# Patient Record
Sex: Female | Born: 1940 | Race: White | Hispanic: No | Smoking: Former smoker
Health system: Southern US, Community
[De-identification: ages and names within clinical notes are randomized; demographics above are authoritative.]

## PROBLEM LIST (undated history)

## (undated) DIAGNOSIS — I2510 Atherosclerotic heart disease of native coronary artery without angina pectoris: Secondary | ICD-10-CM

## (undated) DIAGNOSIS — I10 Essential (primary) hypertension: Secondary | ICD-10-CM

## (undated) DIAGNOSIS — G709 Myoneural disorder, unspecified: Secondary | ICD-10-CM

## (undated) DIAGNOSIS — F32A Depression, unspecified: Secondary | ICD-10-CM

## (undated) DIAGNOSIS — Z972 Presence of dental prosthetic device (complete) (partial): Secondary | ICD-10-CM

## (undated) DIAGNOSIS — J189 Pneumonia, unspecified organism: Secondary | ICD-10-CM

## (undated) DIAGNOSIS — E119 Type 2 diabetes mellitus without complications: Secondary | ICD-10-CM

## (undated) DIAGNOSIS — R011 Cardiac murmur, unspecified: Secondary | ICD-10-CM

## (undated) DIAGNOSIS — C801 Malignant (primary) neoplasm, unspecified: Secondary | ICD-10-CM

## (undated) DIAGNOSIS — F329 Major depressive disorder, single episode, unspecified: Secondary | ICD-10-CM

## (undated) DIAGNOSIS — F419 Anxiety disorder, unspecified: Secondary | ICD-10-CM

## (undated) DIAGNOSIS — IMO0001 Reserved for inherently not codable concepts without codable children: Secondary | ICD-10-CM

## (undated) DIAGNOSIS — M1990 Unspecified osteoarthritis, unspecified site: Secondary | ICD-10-CM

## (undated) DIAGNOSIS — K219 Gastro-esophageal reflux disease without esophagitis: Secondary | ICD-10-CM

## (undated) DIAGNOSIS — G4730 Sleep apnea, unspecified: Secondary | ICD-10-CM

## (undated) DIAGNOSIS — I639 Cerebral infarction, unspecified: Secondary | ICD-10-CM

## (undated) DIAGNOSIS — F319 Bipolar disorder, unspecified: Secondary | ICD-10-CM

## (undated) DIAGNOSIS — R52 Pain, unspecified: Secondary | ICD-10-CM

## (undated) HISTORY — PX: EYE SURGERY: SHX253

## (undated) HISTORY — PX: COLONOSCOPY: SHX174

## (undated) HISTORY — PX: ABDOMINAL HYSTERECTOMY: SHX81

## (undated) HISTORY — PX: CATARACT EXTRACTION: SUR2

## (undated) HISTORY — PX: BLADDER SUSPENSION: SHX72

## (undated) HISTORY — PX: APPENDECTOMY: SHX54

## (undated) HISTORY — PX: TONSILLECTOMY: SUR1361

## (undated) HISTORY — PX: BACK SURGERY: SHX140

## (undated) HISTORY — PX: CORONARY ANGIOPLASTY WITH STENT PLACEMENT: SHX49

## (undated) HISTORY — PX: JOINT REPLACEMENT: SHX530

---

## 2006-06-25 ENCOUNTER — Ambulatory Visit: Payer: Self-pay

## 2006-06-27 ENCOUNTER — Ambulatory Visit: Payer: Self-pay

## 2006-09-19 ENCOUNTER — Ambulatory Visit: Payer: Self-pay

## 2009-01-31 ENCOUNTER — Ambulatory Visit: Payer: Self-pay | Admitting: Internal Medicine

## 2009-07-31 ENCOUNTER — Ambulatory Visit: Payer: Self-pay | Admitting: Internal Medicine

## 2009-12-10 ENCOUNTER — Ambulatory Visit: Payer: Self-pay | Admitting: Internal Medicine

## 2010-07-01 ENCOUNTER — Ambulatory Visit: Payer: Self-pay | Admitting: Internal Medicine

## 2010-07-12 ENCOUNTER — Ambulatory Visit: Payer: Self-pay | Admitting: Family Medicine

## 2012-01-12 ENCOUNTER — Ambulatory Visit: Payer: Self-pay

## 2012-09-29 ENCOUNTER — Ambulatory Visit: Payer: Self-pay | Admitting: Family Medicine

## 2012-09-29 LAB — URINALYSIS, COMPLETE
Bilirubin,UR: NEGATIVE
Ketone: NEGATIVE
Specific Gravity: 1.02 (ref 1.003–1.030)
Squamous Epithelial: NONE SEEN

## 2012-10-01 LAB — URINE CULTURE

## 2014-01-20 ENCOUNTER — Ambulatory Visit: Payer: Self-pay | Admitting: Physician Assistant

## 2014-01-20 LAB — URINALYSIS, COMPLETE
Bilirubin,UR: NEGATIVE
Glucose,UR: 500
KETONE: NEGATIVE
Nitrite: POSITIVE
Ph: 6 (ref 5.0–8.0)
Specific Gravity: 1.02 (ref 1.000–1.030)

## 2014-01-22 LAB — URINE CULTURE

## 2015-01-25 ENCOUNTER — Ambulatory Visit
Admission: EM | Admit: 2015-01-25 | Discharge: 2015-01-25 | Disposition: A | Discharge status: Home / Self Care | Payer: Medicare Other | Attending: Family Medicine | Admitting: Family Medicine

## 2015-01-25 DIAGNOSIS — L03011 Cellulitis of right finger: Secondary | ICD-10-CM | POA: Diagnosis not present

## 2015-01-25 HISTORY — DX: Type 2 diabetes mellitus without complications: E11.9

## 2015-01-25 HISTORY — DX: Atherosclerotic heart disease of native coronary artery without angina pectoris: I25.10

## 2015-01-25 HISTORY — DX: Essential (primary) hypertension: I10

## 2015-01-25 MED ORDER — SULFAMETHOXAZOLE-TRIMETHOPRIM 800-160 MG PO TABS: 1.0000 | ORAL_TABLET | Freq: Two times a day (BID) | ORAL | Status: AC

## 2015-01-25 NOTE — ED Notes (Addendum)
Had a hangnail right 4th fingernail and pulled and clipped off 4 days ago. Now reddness and with purulent color under the tip beside nail. Hx of DM and Glucose was 333 mg/dL at 0630 hrs. Today and 500 yesterday

## 2015-01-25 NOTE — ED Provider Notes (Signed)
Presents today with symptoms of right fourth digit pain and swelling. She states that about 4 days ago she pulled a hangnail from that digit. She has noticed a pus pocket and redness around the area of the nailbed. She has noticed some elevation of her blood sugars as she is diabetic. She denies any other infection anywhere else.  Review systems negative except mentioned above.  GENERAL: NAD RESP: CTA B CARD: RRR SKIN: mild and moderate erythema and swelling of right distal fourth digit, +pus pocket along the nailbed, increased tenderness of the area, decreased ROM, nv intact  NEURO: CN II-XII grossly intact   A/P: R Fourth Digit Paronychia- Procedure Note: Sterile technique was used. Lidocaine 1% without epi was used to digitally block the right fourth digit. An 11 blade was used to drain the region along the nailbed. There was white yellow pus that was expressed. Patient tolerated the procedure well. Instructed patient on keeping the area dry and clean, dressing was placed. Will put patient on Bactrim DS for a week. She is to monitor her blood sugars and the area for any worsening symptoms. If her symptoms do worsen she is to seek medical attention as discussed.     Paulina Fusi, MD 01/25/15 [phone]

## 2015-08-03 ENCOUNTER — Encounter: Payer: Self-pay | Admitting: *Deleted

## 2015-08-03 NOTE — Pre-Procedure Instructions (Signed)
CARDIOLOGIST DR Chong Sicilian [facility] OUT OF OFFICE UNTIL 08/09/15. PATIENT SAID DR MILLERTOLD HER SHE IS GOOD TO HAVE CATARACT SURGERY AND CATH DONE 6/17 AT [facility]. FAXED TO [facility] MEDICAL RECORDS TO OBTAIN COPY OF CATH SUMMARY

## 2015-08-07 NOTE — Pre-Procedure Instructions (Signed)
Cath report [facility] everywhere other results

## 2015-08-08 ENCOUNTER — Ambulatory Visit: Payer: Medicare Other | Admitting: Anesthesiology

## 2015-08-08 ENCOUNTER — Encounter: Payer: Self-pay | Admitting: *Deleted

## 2015-08-08 ENCOUNTER — Encounter
Admission: RE | Disposition: A | Discharge status: Home / Self Care | Payer: Self-pay | Source: Ambulatory Visit | Attending: Ophthalmology

## 2015-08-08 ENCOUNTER — Ambulatory Visit
Admission: RE | Admit: 2015-08-08 | Discharge: 2015-08-08 | Disposition: A | Discharge status: Home / Self Care | Payer: Medicare Other | Source: Ambulatory Visit | Attending: Ophthalmology | Admitting: Ophthalmology

## 2015-08-08 DIAGNOSIS — K219 Gastro-esophageal reflux disease without esophagitis: Secondary | ICD-10-CM | POA: Diagnosis not present

## 2015-08-08 DIAGNOSIS — R0602 Shortness of breath: Secondary | ICD-10-CM | POA: Diagnosis not present

## 2015-08-08 DIAGNOSIS — Z881 Allergy status to other antibiotic agents status: Secondary | ICD-10-CM | POA: Insufficient documentation

## 2015-08-08 DIAGNOSIS — F17290 Nicotine dependence, other tobacco product, uncomplicated: Secondary | ICD-10-CM | POA: Diagnosis not present

## 2015-08-08 DIAGNOSIS — Z96659 Presence of unspecified artificial knee joint: Secondary | ICD-10-CM | POA: Diagnosis not present

## 2015-08-08 DIAGNOSIS — Z955 Presence of coronary angioplasty implant and graft: Secondary | ICD-10-CM | POA: Diagnosis not present

## 2015-08-08 DIAGNOSIS — M1990 Unspecified osteoarthritis, unspecified site: Secondary | ICD-10-CM | POA: Diagnosis not present

## 2015-08-08 DIAGNOSIS — Z85828 Personal history of other malignant neoplasm of skin: Secondary | ICD-10-CM | POA: Insufficient documentation

## 2015-08-08 DIAGNOSIS — I2510 Atherosclerotic heart disease of native coronary artery without angina pectoris: Secondary | ICD-10-CM | POA: Diagnosis not present

## 2015-08-08 DIAGNOSIS — Z888 Allergy status to other drugs, medicaments and biological substances status: Secondary | ICD-10-CM | POA: Diagnosis not present

## 2015-08-08 DIAGNOSIS — Z90710 Acquired absence of both cervix and uterus: Secondary | ICD-10-CM | POA: Insufficient documentation

## 2015-08-08 DIAGNOSIS — H2512 Age-related nuclear cataract, left eye: Secondary | ICD-10-CM | POA: Insufficient documentation

## 2015-08-08 DIAGNOSIS — J40 Bronchitis, not specified as acute or chronic: Secondary | ICD-10-CM | POA: Diagnosis not present

## 2015-08-08 DIAGNOSIS — R011 Cardiac murmur, unspecified: Secondary | ICD-10-CM | POA: Diagnosis not present

## 2015-08-08 DIAGNOSIS — E7800 Pure hypercholesterolemia, unspecified: Secondary | ICD-10-CM | POA: Diagnosis not present

## 2015-08-08 DIAGNOSIS — E1140 Type 2 diabetes mellitus with diabetic neuropathy, unspecified: Secondary | ICD-10-CM | POA: Diagnosis not present

## 2015-08-08 HISTORY — DX: Depression, unspecified: F32.A

## 2015-08-08 HISTORY — DX: Anxiety disorder, unspecified: F41.9

## 2015-08-08 HISTORY — DX: Malignant (primary) neoplasm, unspecified: C80.1

## 2015-08-08 HISTORY — DX: Cardiac murmur, unspecified: R01.1

## 2015-08-08 HISTORY — DX: Reserved for inherently not codable concepts without codable children: IMO0001

## 2015-08-08 HISTORY — DX: Myoneural disorder, unspecified: G70.9

## 2015-08-08 HISTORY — DX: Bipolar disorder, unspecified: F31.9

## 2015-08-08 HISTORY — DX: Pain, unspecified: R52

## 2015-08-08 HISTORY — DX: Unspecified osteoarthritis, unspecified site: M19.90

## 2015-08-08 HISTORY — DX: Gastro-esophageal reflux disease without esophagitis: K21.9

## 2015-08-08 HISTORY — PX: CATARACT EXTRACTION W/PHACO: SHX586

## 2015-08-08 HISTORY — DX: Major depressive disorder, single episode, unspecified: F32.9

## 2015-08-08 LAB — GLUCOSE, CAPILLARY: Glucose-Capillary: 197 mg/dL — ABNORMAL HIGH (ref 65–99)

## 2015-08-08 SURGERY — PHACOEMULSIFICATION, CATARACT, WITH IOL INSERTION
Anesthesia: Monitor Anesthesia Care | Site: Eye | Laterality: Left | Wound class: Clean

## 2015-08-08 MED ORDER — ARMC OPHTHALMIC DILATING GEL
1.0000 "application " | OPHTHALMIC | Status: DC | PRN
  Administered 2015-08-08: 1 via OPHTHALMIC

## 2015-08-08 MED ORDER — EPINEPHRINE HCL 1 MG/ML IJ SOLN
INTRAMUSCULAR | Status: AC
  Filled 2015-08-08: qty 1

## 2015-08-08 MED ORDER — EPINEPHRINE HCL 1 MG/ML IJ SOLN
INTRAMUSCULAR | Status: DC | PRN
  Administered 2015-08-08: 1 mL via OPHTHALMIC

## 2015-08-08 MED ORDER — POVIDONE-IODINE 5 % OP SOLN
1.0000 "application " | Freq: Once | OPHTHALMIC | Status: AC
  Administered 2015-08-08: 1 via OPHTHALMIC

## 2015-08-08 MED ORDER — CEFUROXIME OPHTHALMIC INJECTION 1 MG/0.1 ML
INJECTION | OPHTHALMIC | Status: DC | PRN
  Administered 2015-08-08: .1 mL via INTRACAMERAL

## 2015-08-08 MED ORDER — CEFUROXIME OPHTHALMIC INJECTION 1 MG/0.1 ML
INJECTION | OPHTHALMIC | Status: AC
  Filled 2015-08-08: qty 0.1

## 2015-08-08 MED ORDER — ALFENTANIL 500 MCG/ML IJ INJ
INJECTION | INTRAMUSCULAR | Status: DC | PRN
  Administered 2015-08-08 (×2): 250 ug via INTRAVENOUS

## 2015-08-08 MED ORDER — POVIDONE-IODINE 5 % OP SOLN
OPHTHALMIC | Status: AC
  Administered 2015-08-08: 1 via OPHTHALMIC
  Filled 2015-08-08: qty 30

## 2015-08-08 MED ORDER — CARBACHOL 0.01 % IO SOLN
INTRAOCULAR | Status: DC | PRN
Start: 2015-08-08 — End: 2015-08-08
  Administered 2015-08-08: .5 mL via INTRAOCULAR

## 2015-08-08 MED ORDER — ARMC OPHTHALMIC DILATING GEL
OPHTHALMIC | Status: AC
  Administered 2015-08-08: 1 via OPHTHALMIC
  Filled 2015-08-08: qty 0.25

## 2015-08-08 MED ORDER — NA CHONDROIT SULF-NA HYALURON 40-17 MG/ML IO SOLN
INTRAOCULAR | Status: AC
  Filled 2015-08-08: qty 1

## 2015-08-08 MED ORDER — TETRACAINE HCL 0.5 % OP SOLN
1.0000 [drp] | Freq: Once | OPHTHALMIC | Status: AC
  Administered 2015-08-08: 1 [drp] via OPHTHALMIC

## 2015-08-08 MED ORDER — SODIUM CHLORIDE 0.9 % IV SOLN
INTRAVENOUS | Status: DC
  Administered 2015-08-08: 09:00:00 via INTRAVENOUS

## 2015-08-08 MED ORDER — TETRACAINE HCL 0.5 % OP SOLN
OPHTHALMIC | Status: AC
  Administered 2015-08-08: 1 [drp] via OPHTHALMIC
  Filled 2015-08-08: qty 2

## 2015-08-08 MED ORDER — MOXIFLOXACIN HCL 0.5 % OP SOLN: 1.0000 [drp] | OPHTHALMIC | Status: DC | PRN

## 2015-08-08 MED ORDER — MIDAZOLAM HCL 2 MG/2ML IJ SOLN
INTRAMUSCULAR | Status: DC | PRN
  Administered 2015-08-08: 1 mg via INTRAVENOUS

## 2015-08-08 MED ORDER — MOXIFLOXACIN HCL 0.5 % OP SOLN
OPHTHALMIC | Status: AC
  Filled 2015-08-08: qty 3

## 2015-08-08 MED ORDER — MOXIFLOXACIN HCL 0.5 % OP SOLN
OPHTHALMIC | Status: DC | PRN
  Administered 2015-08-08: 1 [drp] via OPHTHALMIC

## 2015-08-08 MED ORDER — NA CHONDROIT SULF-NA HYALURON 40-17 MG/ML IO SOLN
INTRAOCULAR | Status: DC | PRN
  Administered 2015-08-08: 1 mL via INTRAOCULAR

## 2015-08-08 SURGICAL SUPPLY — 21 items
CANNULA ANT/CHMB 27GA (MISCELLANEOUS) ×3 IMPLANT
CUP MEDICINE 2OZ PLAST GRAD ST (MISCELLANEOUS) ×3 IMPLANT
GLOVE BIO SURGEON STRL SZ8 (GLOVE) ×3 IMPLANT
GLOVE BIOGEL M 6.5 STRL (GLOVE) ×3 IMPLANT
GLOVE SURG LX 8.0 MICRO (GLOVE) ×2
GLOVE SURG LX STRL 8.0 MICRO (GLOVE) ×1 IMPLANT
GOWN STRL REUS W/ TWL LRG LVL3 (GOWN DISPOSABLE) ×2 IMPLANT
GOWN STRL REUS W/TWL LRG LVL3 (GOWN DISPOSABLE) ×4
LENS IOL TECNIS ITEC 20.5 (Intraocular Lens) ×3 IMPLANT
PACK CATARACT (MISCELLANEOUS) ×3 IMPLANT
PACK CATARACT BRASINGTON LX (MISCELLANEOUS) ×3 IMPLANT
PACK EYE AFTER SURG (MISCELLANEOUS) ×3 IMPLANT
SOL BSS BAG (MISCELLANEOUS) ×3
SOL PREP PVP 2OZ (MISCELLANEOUS) ×3
SOLUTION BSS BAG (MISCELLANEOUS) ×1 IMPLANT
SOLUTION PREP PVP 2OZ (MISCELLANEOUS) ×1 IMPLANT
SYR 3ML LL SCALE MARK (SYRINGE) ×3 IMPLANT
SYR 5ML LL (SYRINGE) ×3 IMPLANT
SYR TB 1ML 27GX1/2 LL (SYRINGE) ×3 IMPLANT
WATER STERILE IRR 1000ML POUR (IV SOLUTION) ×3 IMPLANT
WIPE NON LINTING 3.25X3.25 (MISCELLANEOUS) ×3 IMPLANT

## 2015-08-08 NOTE — Anesthesia Postprocedure Evaluation (Signed)
Anesthesia Post Note  Patient: Haley Mooney  Procedure(s) Performed: Procedure(s) (LRB): CATARACT EXTRACTION PHACO AND INTRAOCULAR LENS PLACEMENT (IOC) (Left)  Patient location during evaluation: PACU Anesthesia Type: General Level of consciousness: awake and alert Pain management: pain level controlled Vital Signs Assessment: post-procedure vital signs reviewed and stable Respiratory status: spontaneous breathing, nonlabored ventilation, respiratory function stable and patient connected to nasal cannula oxygen Cardiovascular status: blood pressure returned to baseline and stable Postop Assessment: no signs of nausea or vomiting Anesthetic complications: no    Last Vitals:  Vitals:   08/08/15 1002 08/08/15 1019  BP: 124/72 130/77  Pulse: 61 67  Resp:  16  Temp: 36.6 C     Last Pain:  Vitals:   08/08/15 0844  TempSrc: Oral                 Molli Barrows

## 2015-08-08 NOTE — H&P (Signed)
  All labs reviewed. Abnormal studies sent to patients PCP when indicated.  Previous H&P reviewed, patient examined, there are NO CHANGES.  Kuper Rennels [name]8/1/20179:32 AM

## 2015-08-08 NOTE — Anesthesia Preprocedure Evaluation (Signed)
Anesthesia Evaluation  Patient identified by MRN, date of birth, ID band Patient awake    Reviewed: Allergy & Precautions, H&P , NPO status , Patient's Chart, lab work & pertinent test results, reviewed documented beta blocker date and time   Airway Mallampati: II  TM Distance: >3 FB Neck ROM: full    Dental no notable dental hx. (+) Poor Dentition   Pulmonary neg pulmonary ROS, former smoker,    Pulmonary exam normal breath sounds clear to auscultation       Cardiovascular Exercise Tolerance: Good hypertension, + CAD  negative cardio ROS  + Valvular Problems/Murmurs  Rhythm:regular Rate:Normal     Neuro/Psych PSYCHIATRIC DISORDERS  Neuromuscular disease negative neurological ROS  negative psych ROS   GI/Hepatic negative GI ROS, Neg liver ROS, GERD  Medicated,  Endo/Other  negative endocrine ROSdiabetes  Renal/GU      Musculoskeletal   Abdominal   Peds  Hematology negative hematology ROS (+)   Anesthesia Other Findings   Reproductive/Obstetrics negative OB ROS                             Anesthesia Physical Anesthesia Plan  ASA: III  Anesthesia Plan: MAC   Post-op Pain Management:    Induction:   Airway Management Planned:   Additional Equipment:   Intra-op Plan:   Post-operative Plan:   Informed Consent: I have reviewed the patients History and Physical, chart, labs and discussed the procedure including the risks, benefits and alternatives for the proposed anesthesia with the patient or authorized representative who has indicated his/her understanding and acceptance.     Plan Discussed with: CRNA  Anesthesia Plan Comments:         Anesthesia Quick Evaluation

## 2015-08-08 NOTE — Op Note (Signed)
PREOPERATIVE DIAGNOSIS:  Nuclear sclerotic cataract of the left eye.   POSTOPERATIVE DIAGNOSIS:  nuclear sclerotic cataract left eye   OPERATIVE PROCEDURE:  Procedure(s): CATARACT EXTRACTION PHACO AND INTRAOCULAR LENS PLACEMENT (IOC)   SURGEON:  Birder Robson, MD.   ANESTHESIA:   Anesthesiologist: Molli Barrows, MD CRNA: Courtney Paris, CRNA  1.      Managed anesthesia care. 2.      Topical tetracaine drops followed by 2% Xylocaine jelly applied in the preoperative holding area.   COMPLICATIONS:  None.   TECHNIQUE:   Stop and chop   DESCRIPTION OF PROCEDURE:  The patient was examined and consented in the preoperative holding area where the aforementioned topical anesthesia was applied to the left eye and then brought back to the Operating Room where the left eye was prepped and draped in the usual sterile ophthalmic fashion and a lid speculum was placed. A paracentesis was created with the side port blade and the anterior chamber was filled with viscoelastic. A near clear corneal incision was performed with the steel keratome. A continuous curvilinear capsulorrhexis was performed with a cystotome followed by the capsulorrhexis forceps. Hydrodissection and hydrodelineation were carried out with BSS on a blunt cannula. The lens was removed in a stop and chop  technique and the remaining cortical material was removed with the irrigation-aspiration handpiece. The capsular bag was inflated with viscoelastic and the Technis ZCB00 lens was placed in the capsular bag without complication. The remaining viscoelastic was removed from the eye with the irrigation-aspiration handpiece. The wounds were hydrated. The anterior chamber was flushed with Miostat and the eye was inflated to physiologic pressure. 0.1 mL of cefuroxime concentration 10 mg/mL was placed in the anterior chamber. The wounds were found to be water tight. The eye was dressed with Vigamox. The patient was given protective glasses to wear  throughout the day and a shield with which to sleep tonight. The patient was also given drops with which to begin a drop regimen today and will follow-up with me in one day.  Implant Name Type Inv. Item Serial No. Manufacturer Lot No. LRB No. Used  LENS IOL DIOP 20.5 - [id number] Intraocular Lens LENS IOL DIOP 20.5 [id number]   Left 1   Procedure(s) with comments: CATARACT EXTRACTION PHACO AND INTRAOCULAR LENS PLACEMENT (IOC) (Left) - [place] 00:55 AP% 17.1 CDE 9.48 fluid pack lot # [id number]  Electronically signed: [place] 08/08/2015 10:01 AM

## 2015-08-08 NOTE — Transfer of Care (Signed)
Immediate Anesthesia Transfer of Care Note  Patient: Emine Fazenbaker  Procedure(s) Performed: Procedure(s) with comments: CATARACT EXTRACTION PHACO AND INTRAOCULAR LENS PLACEMENT (IOC) (Left) - [place] 00:55 AP% 17.1 CDE 9.48 fluid pack lot # [id number]  Patient Location: PACU and Short Stay  Anesthesia Type:MAC  Level of Consciousness: awake, oriented and patient cooperative  Airway & Oxygen Therapy: Patient Spontanous Breathing and Patient connected to nasal cannula oxygen  Post-op Assessment: Report given to RN and Post -op Vital signs reviewed and stable  Post vital signs: Reviewed and stable  Last Vitals:  Vitals:   08/08/15 0844  BP: 110/73  Pulse: 70  Resp: 16  Temp: 37.2 C    Last Pain:  Vitals:   08/08/15 0844  TempSrc: Oral         Complications: No apparent anesthesia complications

## 2015-08-08 NOTE — Discharge Instructions (Signed)
Eye Surgery Discharge Instructions  Expect mild scratchy sensation or mild soreness. DO NOT RUB YOUR EYE!  The day of surgery:  Minimal physical activity, but bed rest is not required  No reading, computer work, or close hand work  No bending, lifting, or straining.  May watch TV  For 24 hours:  No driving, legal decisions, or alcoholic beverages  Safety precautions  Eat anything you prefer: It is better to start with liquids, then soup then solid foods.  _____ Eye patch should be worn until postoperative exam tomorrow.  ____ Solar shield eyeglasses should be worn for comfort in the sunlight/patch while sleeping  Resume all regular medications including aspirin or Coumadin if these were discontinued prior to surgery. You may shower, bathe, shave, or wash your hair. Tylenol may be taken for mild discomfort.  Call your doctor if you experience significant pain, nausea, or vomiting, fever > 101 or other signs of infection. [phone] or [phone] Specific instructions:  Follow-up Information    PORFILIO,WILLIAM [name], MD Follow up on 08/09/2015.   Specialty:  Ophthalmology Why:  10:25 Contact information: [address] [place] [address] [phone]

## 2015-08-15 ENCOUNTER — Encounter: Payer: Self-pay | Admitting: Emergency Medicine

## 2015-08-15 ENCOUNTER — Ambulatory Visit
Admission: EM | Admit: 2015-08-15 | Discharge: 2015-08-15 | Disposition: A | Discharge status: Home / Self Care | Payer: Medicare Other | Attending: Family Medicine | Admitting: Family Medicine

## 2015-08-15 ENCOUNTER — Ambulatory Visit: Payer: Medicare Other

## 2015-08-15 DIAGNOSIS — Z794 Long term (current) use of insulin: Secondary | ICD-10-CM | POA: Insufficient documentation

## 2015-08-15 DIAGNOSIS — M7130 Other bursal cyst, unspecified site: Secondary | ICD-10-CM

## 2015-08-15 DIAGNOSIS — F319 Bipolar disorder, unspecified: Secondary | ICD-10-CM | POA: Insufficient documentation

## 2015-08-15 DIAGNOSIS — Z7982 Long term (current) use of aspirin: Secondary | ICD-10-CM | POA: Diagnosis not present

## 2015-08-15 DIAGNOSIS — Z79899 Other long term (current) drug therapy: Secondary | ICD-10-CM | POA: Insufficient documentation

## 2015-08-15 DIAGNOSIS — K219 Gastro-esophageal reflux disease without esophagitis: Secondary | ICD-10-CM | POA: Diagnosis not present

## 2015-08-15 DIAGNOSIS — F419 Anxiety disorder, unspecified: Secondary | ICD-10-CM | POA: Diagnosis not present

## 2015-08-15 DIAGNOSIS — I2510 Atherosclerotic heart disease of native coronary artery without angina pectoris: Secondary | ICD-10-CM | POA: Diagnosis not present

## 2015-08-15 DIAGNOSIS — F17200 Nicotine dependence, unspecified, uncomplicated: Secondary | ICD-10-CM | POA: Insufficient documentation

## 2015-08-15 DIAGNOSIS — E119 Type 2 diabetes mellitus without complications: Secondary | ICD-10-CM | POA: Insufficient documentation

## 2015-08-15 DIAGNOSIS — I10 Essential (primary) hypertension: Secondary | ICD-10-CM | POA: Diagnosis not present

## 2015-08-15 DIAGNOSIS — M79675 Pain in left toe(s): Secondary | ICD-10-CM | POA: Diagnosis present

## 2015-08-15 DIAGNOSIS — M25872 Other specified joint disorders, left ankle and foot: Secondary | ICD-10-CM | POA: Insufficient documentation

## 2015-08-15 MED ORDER — MUPIROCIN 2 % EX OINT: 1.0000 "application " | TOPICAL_OINTMENT | Freq: Three times a day (TID) | CUTANEOUS | 0 refills | Status: DC

## 2015-08-15 NOTE — ED Triage Notes (Signed)
Patient c/o tender bump on her left 1st toe that started back in May 2017.

## 2015-08-15 NOTE — ED Provider Notes (Signed)
CSN: [id number]     Arrival date & time 08/15/15  [id number] History   First MD Initiated Contact with Patient 08/15/15 [phone]     Chief Complaint  Patient presents with  . Toe Pain   (Consider location/radiation/quality/duration/timing/severity/associated sxs/prior Treatment)  Toe Pain      Assessment 75 year old female diabetic who presents with a cyst that has appeared on her medial left great toe dorsum located just distal to her DIP joint but not involving the nail. He states that it has doubled in size over the last day. She is mostly concerned because of her underlying diabetes. It has not been leaking at the present time and does not appear to be infected. Measures approximately 4 mm wide. It has a central core appearing plugged up. Underlying this cyst is a prominence of the DIP joint.  Past Medical History:  Diagnosis Date  . Anxiety   . Arthritis   . Bipolar disorder ([place])   . CAD (coronary artery disease)   . Cancer ([place])    SKIN  . Depression   . Diabetes mellitus without complication ([place])   . GERD (gastroesophageal reflux disease)    H/O  . Heart murmur   . Hypertension   . Neuromuscular disorder ([place])    NEUROPATHY  . Pain    CHEST OCC WITH EXERTION  . Shortness of breath dyspnea    Past Surgical History:  Procedure Laterality Date  . ABDOMINAL HYSTERECTOMY    . APPENDECTOMY    . BLADDER SUSPENSION    . CATARACT EXTRACTION Left   . CATARACT EXTRACTION W/PHACO Left 08/08/2015   Procedure: CATARACT EXTRACTION PHACO AND INTRAOCULAR LENS PLACEMENT (IOC);  Surgeon: Birder Robson, MD;  Location: [facility] ORS;  Service: Ophthalmology;  Laterality: Left;  [place] 00:55 AP% 17.1 CDE 9.48 fluid pack lot # [id number]  . COLONOSCOPY    . CORONARY ANGIOPLASTY WITH STENT PLACEMENT     CATH DONE [facility] 6/17 DR PAULA MILLER  . EYE SURGERY    . JOINT REPLACEMENT    . TONSILLECTOMY     Family History  Problem Relation Age of Onset  . Heart failure Father    Social History  Substance  Use Topics  . Smoking status: Former Smoker    Types: E-cigarettes  . Smokeless tobacco: Current User  . Alcohol use No   OB History    No data available     Review of Systems  Constitutional: Negative for activity change, chills, fatigue and fever.  Musculoskeletal: Positive for arthralgias.  Skin: Positive for color change.  All other systems reviewed and are negative.   Allergies  Ace inhibitors; Beta adrenergic blockers; and Tetracyclines & related  Home Medications   Prior to Admission medications   Medication Sig Start Date End Date Taking? Authorizing Provider  albuterol (PROVENTIL HFA;VENTOLIN HFA) 108 (90 Base) MCG/ACT inhaler Inhale 2 puffs into the lungs every 6 (six) hours as needed for wheezing or shortness of breath.    Historical Provider, MD  ARIPiprazole (ABILIFY) 5 MG tablet Take 5 mg by mouth daily.    Historical Provider, MD  aspirin 81 MG tablet Take 81 mg by mouth daily.    Historical Provider, MD  atorvastatin (LIPITOR) 80 MG tablet Take 80 mg by mouth daily at 6 PM.    Historical Provider, MD  clonazePAM (KLONOPIN) 0.5 MG tablet Take 0.5 mg by mouth 3 (three) times daily as needed for anxiety.    Historical Provider, MD  gabapentin (NEURONTIN) 300 MG capsule  Take 300 mg by mouth at bedtime.    Historical Provider, MD  Garcinia Cambogia-Chromium 500-200 MG-MCG TABS Take 1 tablet by mouth 2 (two) times daily.    Historical Provider, MD  glipiZIDE (GLUCOTROL) 10 MG tablet Take 10 mg by mouth 2 (two) times daily before a meal.    Historical Provider, MD  insulin detemir (LEVEMIR) 100 UNIT/ML injection Inject 20 Units into the skin at bedtime.    Historical Provider, MD  Melatonin 10 MG CAPS Take 10 mg by mouth at bedtime.    Historical Provider, MD  metFORMIN (GLUMETZA) 1000 MG (MOD) 24 hr tablet Take 1,000 mg by mouth 2 (two) times daily with a meal.    Historical Provider, MD  metoprolol succinate (TOPROL-XL) 50 MG 24 hr tablet Take 50 mg by mouth daily. Take  with or immediately following a meal.    Historical Provider, MD  mupirocin ointment (BACTROBAN) 2 % Apply 1 application topically 3 (three) times daily. 08/15/15   Lorin Picket, PA-C  nitroGLYCERIN (NITROSTAT) 0.4 MG SL tablet Place 0.4 mg under the tongue every 5 (five) minutes as needed for chest pain.    Historical Provider, MD  QUEtiapine (SEROQUEL) 50 MG tablet Take 50 mg by mouth at bedtime.    Historical Provider, MD  venlafaxine XR (EFFEXOR-XR) 150 MG 24 hr capsule Take 150 mg by mouth daily with breakfast.    Historical Provider, MD   Meds Ordered and Administered this Visit  Medications - No data to display  BP 137/70 (BP Location: Right Arm)   Pulse 66   Temp 97.6 F (36.4 C) (Tympanic)   Resp 16   Ht 5\' 6"  (1.676 m)   Wt 220 lb (99.8 kg)   SpO2 98%   BMI 35.51 kg/m  No data found.   Physical Exam  Constitutional: She appears well-developed and well-nourished. No distress.  HENT:  Head: Normocephalic and atraumatic.  Eyes: EOM are normal. Pupils are equal, round, and reactive to light.  Neck: Normal range of motion. Neck supple.  Musculoskeletal: She exhibits edema and tenderness.  Examination of the left great toe shows over the dorsum medial aspect overlying the DIP joint not affecting the nail is a 4 mm round fluid-filled cyst with a central appearing core plug. Early it is not draining. Underlying the cyst is a prominence of the  DIP joint. There is no evidence of infection at the present time. Cyst is not presently leaking.  Skin: She is not diaphoretic.  Nursing note and vitals reviewed.   Urgent Care Course   Clinical Course    Procedures (including critical care time)  Labs Review Labs Reviewed - No data to display  Imaging Review Dg Toe Great Left  Result Date: 08/15/2015 CLINICAL DATA:  Pain in the left great toe with blister, no acute trauma EXAM: LEFT GREAT TOE COMPARISON:  None. FINDINGS: There is focal soft tissue prominence over the dorsal  aspect of the left great toe at the level of the left DIP joint of uncertain significance. No underlying bony abnormality is seen. There is degenerative change involving the left first MTP joint with loss of joint space and sclerosis with spurring. IMPRESSION: 1. Focal soft tissue prominence over the dorsal aspect of the left great toe. No underlying bony abnormality. 2. 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   On: 08/15/2015 09:56     Visual Acuity Review  Right Eye Distance:  Left Eye Distance:   Bilateral Distance:    Right Eye Near:   Left Eye Near:    Bilateral Near:         MDM   1. Myxoid cyst    Discharge Medication List as of 08/15/2015 10:14 AM    START taking these medications   Details  mupirocin ointment (BACTROBAN) 2 % Apply 1 application topically 3 (three) times daily., Starting Tue 08/15/2015, Normal      Plan: 1. Test/x-ray results and diagnosis reviewed with patient 2. rx as per orders; risks, benefits, potential side effects reviewed with patient 3. Recommend supportive treatment with avoidance of pressure over the cyst is much as feasible. I will  provide her with Bactroban ointment to put on the cyst  to minimize the risk of an infection should this rupture. 4. F/u Dr. Vickki Muff for definitive treatment.     Lorin Picket, PA-C 08/15/15 1039

## 2015-08-23 ENCOUNTER — Encounter: Payer: Self-pay | Admitting: *Deleted

## 2015-09-05 ENCOUNTER — Ambulatory Visit: Payer: Medicare Other | Admitting: Certified Registered"

## 2015-09-05 ENCOUNTER — Ambulatory Visit
Admission: RE | Admit: 2015-09-05 | Discharge: 2015-09-05 | Disposition: A | Discharge status: Home / Self Care | Payer: Medicare Other | Source: Ambulatory Visit | Attending: Ophthalmology | Admitting: Ophthalmology

## 2015-09-05 ENCOUNTER — Encounter
Admission: RE | Disposition: A | Discharge status: Home / Self Care | Payer: Self-pay | Source: Ambulatory Visit | Attending: Ophthalmology

## 2015-09-05 ENCOUNTER — Encounter: Payer: Self-pay | Admitting: *Deleted

## 2015-09-05 DIAGNOSIS — Z794 Long term (current) use of insulin: Secondary | ICD-10-CM | POA: Insufficient documentation

## 2015-09-05 DIAGNOSIS — F17290 Nicotine dependence, other tobacco product, uncomplicated: Secondary | ICD-10-CM | POA: Diagnosis not present

## 2015-09-05 DIAGNOSIS — Z85828 Personal history of other malignant neoplasm of skin: Secondary | ICD-10-CM | POA: Diagnosis not present

## 2015-09-05 DIAGNOSIS — Z955 Presence of coronary angioplasty implant and graft: Secondary | ICD-10-CM | POA: Insufficient documentation

## 2015-09-05 DIAGNOSIS — K219 Gastro-esophageal reflux disease without esophagitis: Secondary | ICD-10-CM | POA: Diagnosis not present

## 2015-09-05 DIAGNOSIS — J40 Bronchitis, not specified as acute or chronic: Secondary | ICD-10-CM | POA: Insufficient documentation

## 2015-09-05 DIAGNOSIS — Z96659 Presence of unspecified artificial knee joint: Secondary | ICD-10-CM | POA: Diagnosis not present

## 2015-09-05 DIAGNOSIS — E7800 Pure hypercholesterolemia, unspecified: Secondary | ICD-10-CM | POA: Insufficient documentation

## 2015-09-05 DIAGNOSIS — F418 Other specified anxiety disorders: Secondary | ICD-10-CM | POA: Diagnosis not present

## 2015-09-05 DIAGNOSIS — R011 Cardiac murmur, unspecified: Secondary | ICD-10-CM | POA: Diagnosis not present

## 2015-09-05 DIAGNOSIS — E1136 Type 2 diabetes mellitus with diabetic cataract: Secondary | ICD-10-CM | POA: Diagnosis not present

## 2015-09-05 DIAGNOSIS — F319 Bipolar disorder, unspecified: Secondary | ICD-10-CM | POA: Diagnosis not present

## 2015-09-05 DIAGNOSIS — I10 Essential (primary) hypertension: Secondary | ICD-10-CM | POA: Diagnosis not present

## 2015-09-05 DIAGNOSIS — Z888 Allergy status to other drugs, medicaments and biological substances status: Secondary | ICD-10-CM | POA: Insufficient documentation

## 2015-09-05 DIAGNOSIS — Z90710 Acquired absence of both cervix and uterus: Secondary | ICD-10-CM | POA: Diagnosis not present

## 2015-09-05 DIAGNOSIS — E1140 Type 2 diabetes mellitus with diabetic neuropathy, unspecified: Secondary | ICD-10-CM | POA: Insufficient documentation

## 2015-09-05 DIAGNOSIS — I2510 Atherosclerotic heart disease of native coronary artery without angina pectoris: Secondary | ICD-10-CM | POA: Diagnosis not present

## 2015-09-05 DIAGNOSIS — H2511 Age-related nuclear cataract, right eye: Secondary | ICD-10-CM | POA: Diagnosis present

## 2015-09-05 HISTORY — PX: CATARACT EXTRACTION W/PHACO: SHX586

## 2015-09-05 HISTORY — DX: Pneumonia, unspecified organism: J18.9

## 2015-09-05 LAB — GLUCOSE, CAPILLARY: GLUCOSE-CAPILLARY: 276 mg/dL — AB (ref 65–99)

## 2015-09-05 SURGERY — PHACOEMULSIFICATION, CATARACT, WITH IOL INSERTION
Anesthesia: Monitor Anesthesia Care | Site: Eye | Laterality: Right | Wound class: Clean

## 2015-09-05 MED ORDER — EPINEPHRINE HCL 1 MG/ML IJ SOLN
INTRAOCULAR | Status: DC | PRN
  Administered 2015-09-05: 200 mL via OPHTHALMIC

## 2015-09-05 MED ORDER — MIDAZOLAM HCL 2 MG/2ML IJ SOLN
INTRAMUSCULAR | Status: DC | PRN
  Administered 2015-09-05 (×2): 1 mg via INTRAVENOUS

## 2015-09-05 MED ORDER — CARBACHOL 0.01 % IO SOLN
INTRAOCULAR | Status: DC | PRN
  Administered 2015-09-05: 0.5 mL via INTRAOCULAR

## 2015-09-05 MED ORDER — SODIUM CHLORIDE 0.9 % IV SOLN
INTRAVENOUS | Status: DC
  Administered 2015-09-05: 10:00:00 via INTRAVENOUS

## 2015-09-05 MED ORDER — METOPROLOL TARTRATE 50 MG PO TABS
ORAL_TABLET | ORAL | Status: AC
  Administered 2015-09-05: 50 mg via ORAL
  Filled 2015-09-05: qty 1

## 2015-09-05 MED ORDER — POVIDONE-IODINE 5 % OP SOLN
1.0000 "application " | Freq: Once | OPHTHALMIC | Status: AC
  Administered 2015-09-05: 1 via OPHTHALMIC

## 2015-09-05 MED ORDER — POVIDONE-IODINE 5 % OP SOLN
OPHTHALMIC | Status: AC
  Administered 2015-09-05: 1 via OPHTHALMIC
  Filled 2015-09-05: qty 30

## 2015-09-05 MED ORDER — MOXIFLOXACIN HCL 0.5 % OP SOLN
OPHTHALMIC | Status: AC
  Filled 2015-09-05: qty 3

## 2015-09-05 MED ORDER — NA CHONDROIT SULF-NA HYALURON 40-17 MG/ML IO SOLN
INTRAOCULAR | Status: AC
  Filled 2015-09-05: qty 1

## 2015-09-05 MED ORDER — TETRACAINE HCL 0.5 % OP SOLN
1.0000 [drp] | Freq: Once | OPHTHALMIC | Status: AC
  Administered 2015-09-05: 1 [drp] via OPHTHALMIC

## 2015-09-05 MED ORDER — CEFUROXIME OPHTHALMIC INJECTION 1 MG/0.1 ML
INJECTION | OPHTHALMIC | Status: AC
  Filled 2015-09-05: qty 0.1

## 2015-09-05 MED ORDER — ARMC OPHTHALMIC DILATING GEL
OPHTHALMIC | Status: AC
  Administered 2015-09-05: 1 via OPHTHALMIC
  Filled 2015-09-05: qty 0.25

## 2015-09-05 MED ORDER — NA CHONDROIT SULF-NA HYALURON 40-17 MG/ML IO SOLN
INTRAOCULAR | Status: DC | PRN
  Administered 2015-09-05: 1 mL via INTRAOCULAR

## 2015-09-05 MED ORDER — MOXIFLOXACIN HCL 0.5 % OP SOLN: 1.0000 [drp] | OPHTHALMIC | Status: DC | PRN

## 2015-09-05 MED ORDER — FENTANYL CITRATE (PF) 100 MCG/2ML IJ SOLN
INTRAMUSCULAR | Status: DC | PRN
  Administered 2015-09-05: 50 ug via INTRAVENOUS

## 2015-09-05 MED ORDER — EPINEPHRINE HCL 1 MG/ML IJ SOLN
INTRAMUSCULAR | Status: AC
  Filled 2015-09-05: qty 1

## 2015-09-05 MED ORDER — CEFUROXIME OPHTHALMIC INJECTION 1 MG/0.1 ML
INJECTION | OPHTHALMIC | Status: DC | PRN
  Administered 2015-09-05: 0.1 mL via INTRACAMERAL

## 2015-09-05 MED ORDER — METOPROLOL TARTRATE 50 MG PO TABS
50.0000 mg | ORAL_TABLET | Freq: Once | ORAL | Status: AC
  Administered 2015-09-05: 50 mg via ORAL

## 2015-09-05 MED ORDER — MOXIFLOXACIN HCL 0.5 % OP SOLN
OPHTHALMIC | Status: DC | PRN
  Administered 2015-09-05: 1 [drp] via OPHTHALMIC

## 2015-09-05 MED ORDER — ARMC OPHTHALMIC DILATING GEL
1.0000 | OPHTHALMIC | Status: AC | PRN
Start: 2015-09-05 — End: 2015-09-05
  Administered 2015-09-05 (×2): 1 via OPHTHALMIC

## 2015-09-05 MED ORDER — TETRACAINE HCL 0.5 % OP SOLN
OPHTHALMIC | Status: AC
  Administered 2015-09-05: 1 [drp] via OPHTHALMIC
  Filled 2015-09-05: qty 2

## 2015-09-05 SURGICAL SUPPLY — 21 items
CANNULA ANT/CHMB 27GA (MISCELLANEOUS) ×3 IMPLANT
CUP MEDICINE 2OZ PLAST GRAD ST (MISCELLANEOUS) ×3 IMPLANT
GLOVE BIO SURGEON STRL SZ8 (GLOVE) ×3 IMPLANT
GLOVE BIOGEL M 6.5 STRL (GLOVE) ×3 IMPLANT
GLOVE SURG LX 8.0 MICRO (GLOVE) ×2
GLOVE SURG LX STRL 8.0 MICRO (GLOVE) ×1 IMPLANT
GOWN STRL REUS W/ TWL LRG LVL3 (GOWN DISPOSABLE) ×2 IMPLANT
GOWN STRL REUS W/TWL LRG LVL3 (GOWN DISPOSABLE) ×4
LENS IOL TECNIS ITEC 20.5 (Intraocular Lens) ×3 IMPLANT
PACK CATARACT (MISCELLANEOUS) ×3 IMPLANT
PACK CATARACT BRASINGTON LX (MISCELLANEOUS) ×3 IMPLANT
PACK EYE AFTER SURG (MISCELLANEOUS) ×3 IMPLANT
SOL BSS BAG (MISCELLANEOUS) ×3
SOL PREP PVP 2OZ (MISCELLANEOUS) ×3
SOLUTION BSS BAG (MISCELLANEOUS) ×1 IMPLANT
SOLUTION PREP PVP 2OZ (MISCELLANEOUS) ×1 IMPLANT
SYR 3ML LL SCALE MARK (SYRINGE) ×3 IMPLANT
SYR 5ML LL (SYRINGE) ×3 IMPLANT
SYR TB 1ML 27GX1/2 LL (SYRINGE) ×3 IMPLANT
WATER STERILE IRR 250ML POUR (IV SOLUTION) ×3 IMPLANT
WIPE NON LINTING 3.25X3.25 (MISCELLANEOUS) ×3 IMPLANT

## 2015-09-05 NOTE — Discharge Instructions (Signed)

## 2015-09-05 NOTE — H&P (Signed)
  All labs reviewed. Abnormal studies sent to patients PCP when indicated.  Previous H&P reviewed, patient examined, there are NO CHANGES.  Haley Mooney [name]8/29/201710:57 AM

## 2015-09-05 NOTE — Transfer of Care (Signed)
Immediate Anesthesia Transfer of Care Note  Patient: Haley Mooney  Procedure(s) Performed: Procedure(s) with comments: CATARACT EXTRACTION PHACO AND INTRAOCULAR LENS PLACEMENT (IOC) (Right) - Lot# [id number] H US:00:43.0 AP%: 20.3 CDE: 8.74  Patient Location: Short Stay  Anesthesia Type:MAC  Level of Consciousness: awake, alert  and oriented  Airway & Oxygen Therapy: Patient Spontanous Breathing  Post-op Assessment: Report given to RN and Post -op Vital signs reviewed and stable  Post vital signs: Reviewed  Last Vitals:  Vitals:   09/05/15 0936 09/05/15 1128  BP: 130/85 135/73  Pulse: 67 62  Resp: 16 16  Temp: (!) 35.7 C 36.5 C    Last Pain:  Vitals:   09/05/15 0936  TempSrc: Tympanic         Complications: No apparent anesthesia complications

## 2015-09-05 NOTE — Anesthesia Postprocedure Evaluation (Signed)
Anesthesia Post Note  Patient: Haley Mooney  Procedure(s) Performed: Procedure(s) (LRB): CATARACT EXTRACTION PHACO AND INTRAOCULAR LENS PLACEMENT (IOC) (Right)  Patient location during evaluation: Short Stay Anesthesia Type: MAC Level of consciousness: awake and alert Pain management: pain level controlled Vital Signs Assessment: post-procedure vital signs reviewed and stable Respiratory status: spontaneous breathing Cardiovascular status: blood pressure returned to baseline Postop Assessment: no headache Anesthetic complications: no    Last Vitals:  Vitals:   09/05/15 0936 09/05/15 1128  BP: 130/85 135/73  Pulse: 67 62  Resp: 16 16  Temp: (!) 35.7 C 36.5 C    Last Pain:  Vitals:   09/05/15 0936  TempSrc: Tympanic                 Brantley Fling

## 2015-09-05 NOTE — Op Note (Signed)
PREOPERATIVE DIAGNOSIS:  Nuclear sclerotic cataract of the right eye.   POSTOPERATIVE DIAGNOSIS: right nuclear sclerotic cataract   OPERATIVE PROCEDURE:  Procedure(s): CATARACT EXTRACTION PHACO AND INTRAOCULAR LENS PLACEMENT (IOC)   SURGEON:  Birder Robson, MD.   ANESTHESIA:  Anesthesiologist: Gunnar Fusi, MD CRNA: Rolla Plate, CRNA  1.      Managed anesthesia care. 2.      Topical tetracaine drops followed by 2% Xylocaine jelly applied in the preoperative holding area.   COMPLICATIONS:  None.   TECHNIQUE:   Stop and chop   DESCRIPTION OF PROCEDURE:  The patient was examined and consented in the preoperative holding area where the aforementioned topical anesthesia was applied to the right eye and then brought back to the Operating Room where the right eye was prepped and draped in the usual sterile ophthalmic fashion and a lid speculum was placed. A paracentesis was created with the side port blade and the anterior chamber was filled with viscoelastic. A near clear corneal incision was performed with the steel keratome. A continuous curvilinear capsulorrhexis was performed with a cystotome followed by the capsulorrhexis forceps. Hydrodissection and hydrodelineation were carried out with BSS on a blunt cannula. The lens was removed in a stop and chop  technique and the remaining cortical material was removed with the irrigation-aspiration handpiece. The capsular bag was inflated with viscoelastic and the Technis ZCB00  lens was placed in the capsular bag without complication. The remaining viscoelastic was removed from the eye with the irrigation-aspiration handpiece. The wounds were hydrated. The anterior chamber was flushed with Miostat and the eye was inflated to physiologic pressure. 0.1 mL of cefuroxime concentration 10 mg/mL was placed in the anterior chamber. The wounds were found to be water tight. The eye was dressed with Vigamox. The patient was given protective glasses to  wear throughout the day and a shield with which to sleep tonight. The patient was also given drops with which to begin a drop regimen today and will follow-up with me in one day.  Implant Name Type Inv. Item Serial No. Manufacturer Lot No. LRB No. Used  LENS IOL DIOP 20.5 - [id number] Intraocular Lens LENS IOL DIOP 20.5 [id number]   Right 1   Procedure(s) with comments: CATARACT EXTRACTION PHACO AND INTRAOCULAR LENS PLACEMENT (IOC) (Right) - Lot# [id number] H US:00:43.0 AP%: 20.3 CDE: 8.74  Electronically signed: [place] 09/05/2015 11:26 AM

## 2015-09-05 NOTE — Anesthesia Preprocedure Evaluation (Signed)
Anesthesia Evaluation    Airway Mallampati: III       Dental  (+) Upper Dentures, Lower Dentures   Pulmonary shortness of breath, former smoker,           Cardiovascular hypertension, Pt. on medications and Pt. on home beta blockers + CAD  + Valvular Problems/Murmurs (murmur)      Neuro/Psych Anxiety Depression Bipolar Disorder    GI/Hepatic GERD (pt denies at present, positive history)  ,  Endo/Other  diabetes, Type 2, Insulin Dependent  Renal/GU      Musculoskeletal   Abdominal   Peds  Hematology   Anesthesia Other Findings   Reproductive/Obstetrics                             Anesthesia Physical Anesthesia Plan  ASA: III  Anesthesia Plan: MAC   Post-op Pain Management:    Induction: Intravenous  Airway Management Planned:   Additional Equipment:   Intra-op Plan:   Post-operative Plan:   Informed Consent: I have reviewed the patients History and Physical, chart, labs and discussed the procedure including the risks, benefits and alternatives for the proposed anesthesia with the patient or authorized representative who has indicated his/her understanding and acceptance.     Plan Discussed with:   Anesthesia Plan Comments:         Anesthesia Quick Evaluation

## 2015-09-06 ENCOUNTER — Encounter: Payer: Self-pay | Admitting: Ophthalmology

## 2015-12-23 ENCOUNTER — Ambulatory Visit
Admission: EM | Admit: 2015-12-23 | Discharge: 2015-12-23 | Disposition: A | Discharge status: Home / Self Care | Payer: Medicare Other

## 2015-12-23 DIAGNOSIS — R21 Rash and other nonspecific skin eruption: Secondary | ICD-10-CM

## 2015-12-23 DIAGNOSIS — B372 Candidiasis of skin and nail: Secondary | ICD-10-CM | POA: Diagnosis not present

## 2015-12-23 MED ORDER — NYSTATIN-TRIAMCINOLONE 100000-0.1 UNIT/GM-% EX CREA: TOPICAL_CREAM | CUTANEOUS | 0 refills | Status: AC

## 2015-12-23 MED ORDER — PREDNISONE 10 MG (21) PO TBPK: 10.0000 mg | ORAL_TABLET | Freq: Every day | ORAL | 0 refills | Status: DC

## 2015-12-23 NOTE — ED Provider Notes (Signed)
CSN: [id number]     Arrival date & time 12/23/15  0800 History   First MD Initiated Contact with Patient 12/23/15 0827     Chief Complaint  Patient presents with  . Urticaria   (Consider location/radiation/quality/duration/timing/severity/associated sxs/prior Treatment) Pt has had a itching red rash noticed to bil upper thighs then radiates to bil arms for 10 days now. Pt denies any new soaps or lotions. Was started on Humalog 3 days ago but does not believe it came from this. Pt is a diabetic and she had RT knee surgery in Nov was placed on ABX for 14 days and stopped this approx 1 week ago. Denies any sob, no cough, Took benadryl, and OTC anti itching cream with no relief.        Past Medical History:  Diagnosis Date  . Anxiety   . Arthritis   . Bipolar disorder ([place])   . CAD (coronary artery disease)   . Cancer ([place])    SKIN  . Depression   . Diabetes mellitus without complication ([place])   . GERD (gastroesophageal reflux disease)    H/O  . Heart murmur   . Hypertension   . Neuromuscular disorder ([place])    NEUROPATHY  . Pain    CHEST OCC WITH EXERTION  . Pneumonia    IN PAST  . Shortness of breath dyspnea    Past Surgical History:  Procedure Laterality Date  . ABDOMINAL HYSTERECTOMY    . APPENDECTOMY    . BLADDER SUSPENSION    . CATARACT EXTRACTION Left   . CATARACT EXTRACTION W/PHACO Left 08/08/2015   Procedure: CATARACT EXTRACTION PHACO AND INTRAOCULAR LENS PLACEMENT (IOC);  Surgeon: Birder Robson, MD;  Location: [facility] ORS;  Service: Ophthalmology;  Laterality: Left;  [place] 00:55 AP% 17.1 CDE 9.48 fluid pack lot # [id number]  . CATARACT EXTRACTION W/PHACO Right 09/05/2015   Procedure: CATARACT EXTRACTION PHACO AND INTRAOCULAR LENS PLACEMENT (IOC);  Surgeon: Birder Robson, MD;  Location: [facility] ORS;  Service: Ophthalmology;  Laterality: Right;  Lot# [id number] H US:00:43.0 AP%: 20.3 CDE: 8.74  . COLONOSCOPY    . CORONARY ANGIOPLASTY WITH STENT PLACEMENT     CATH DONE  [facility] 6/17 DR PAULA MILLER  . EYE SURGERY    . JOINT REPLACEMENT    . TONSILLECTOMY     Family History  Problem Relation Age of Onset  . Heart failure Father    Social History  Substance Use Topics  . Smoking status: Former Smoker    Types: E-cigarettes  . Smokeless tobacco: Current User  . Alcohol use Yes     Comment: OCCAS   OB History    No data available     Review of Systems  Allergies  Ace inhibitors and Tetracyclines & related  Home Medications   Prior to Admission medications   Medication Sig Start Date End Date Taking? Authorizing Provider  albuterol (PROVENTIL HFA;VENTOLIN HFA) 108 (90 Base) MCG/ACT inhaler Inhale 2 puffs into the lungs every 6 (six) hours as needed for wheezing or shortness of breath.   Yes Historical Provider, MD  ARIPiprazole (ABILIFY) 5 MG tablet Take 5 mg by mouth daily.   Yes Historical Provider, MD  aspirin 81 MG tablet Take 81 mg by mouth daily.   Yes Historical Provider, MD  atorvastatin (LIPITOR) 80 MG tablet Take 80 mg by mouth daily at 6 PM.   Yes Historical Provider, MD  clonazePAM (KLONOPIN) 0.5 MG tablet Take 0.5 mg by mouth 3 (three) times daily as needed for anxiety.  Yes Historical Provider, MD  glipiZIDE (GLUCOTROL) 10 MG tablet Take 10 mg by mouth 2 (two) times daily before a meal.   Yes Historical Provider, MD  hydrochlorothiazide (HYDRODIURIL) 25 MG tablet Take 25 mg by mouth daily.   Yes Historical Provider, MD  ibuprofen (ADVIL,MOTRIN) 200 MG tablet Take 200 mg by mouth every 6 (six) hours as needed.   Yes Historical Provider, MD  insulin detemir (LEVEMIR) 100 UNIT/ML injection Inject 20 Units into the skin at bedtime.   Yes Historical Provider, MD  insulin lispro (HUMALOG) 100 UNIT/ML injection Inject into the skin 3 (three) times daily before meals.   Yes Historical Provider, MD  Melatonin 10 MG CAPS Take 10 mg by mouth at bedtime.   Yes Historical Provider, MD  metFORMIN (GLUMETZA) 1000 MG (MOD) 24 hr tablet Take 500 mg by  mouth 2 (two) times daily with a meal.    Yes Historical Provider, MD  metoprolol succinate (TOPROL-XL) 50 MG 24 hr tablet Take 50 mg by mouth daily. Take with or immediately following a meal.   Yes Historical Provider, MD  nitroGLYCERIN (NITROSTAT) 0.4 MG SL tablet Place 0.4 mg under the tongue every 5 (five) minutes as needed for chest pain.   Yes Historical Provider, MD  oxyCODONE (ROXICODONE) 15 MG immediate release tablet Take 5 mg by mouth every 4 (four) hours as needed for pain.   Yes Historical Provider, MD  QUEtiapine (SEROQUEL) 50 MG tablet Take 50 mg by mouth at bedtime.   Yes Historical Provider, MD  sitaGLIPtin (JANUVIA) 100 MG tablet Take 100 mg by mouth daily.   Yes Historical Provider, MD  venlafaxine XR (EFFEXOR-XR) 150 MG 24 hr capsule Take 150 mg by mouth daily with breakfast.   Yes Historical Provider, MD  Garcinia Cambogia-Chromium 500-200 MG-MCG TABS Take 1 tablet by mouth 2 (two) times daily.    Historical Provider, MD  mupirocin ointment (BACTROBAN) 2 % Apply 1 application topically 3 (three) times daily. 08/15/15   Lorin Picket, PA-C  nystatin-triamcinolone (MYCOLOG II) cream Apply to affected area daily 12/23/15   Melanee Left, NP  predniSONE (STERAPRED UNI-PAK 21 TAB) 10 MG (21) TBPK tablet Take 1 tablet (10 mg total) by mouth daily. Take 6 tabs by mouth daily  for 2 days, then 5 tabs for 2 days, then 4 tabs for 2 days, then 3 tabs for 2 days, 2 tabs for 2 days, then 1 tab by mouth daily for 2 days 12/23/15   Melanee Left, NP   Meds Ordered and Administered this Visit  Medications - No data to display  BP (!) 142/83 (BP Location: Left Arm)   Pulse (!) 104   Temp 98.4 F (36.9 C) (Oral)   Resp 20   Ht 5\' 4"  (1.626 m)   Wt 227 lb (103 kg)   SpO2 99%   BMI 38.96 kg/m  No data found.   Physical Exam  Urgent Care Course   Clinical Course     Procedures (including critical care time)  Labs Review Labs Reviewed - No data to display  Imaging  Review No results found.   ar:         MDM   1. Rash   2. Candidal intertrigo    You will need to monitor to sugar levels to ensure the steroid is not causing elevation. Follow up with your pcp to ensure it is getting better and that it is not the insulin causing an reaction.  Go to  the er if you have sob or chest pain It can take up to 1 week for symptoms to resolve.     Melanee Left, NP 12/23/15 1409

## 2015-12-23 NOTE — Discharge Instructions (Signed)
You will need to monitor to sugar levels to ensure the steroid is not causing elevation. Follow up with your pcp to ensure it is getting better and that it is not the insulin causing an reaction.

## 2015-12-23 NOTE — ED Triage Notes (Signed)
Pt c/o hives that are on the front of her legs and on her shoulder, she also mentions that they itch.

## 2015-12-31 ENCOUNTER — Encounter: Payer: Self-pay | Admitting: Gynecology

## 2015-12-31 ENCOUNTER — Ambulatory Visit
Admission: EM | Admit: 2015-12-31 | Discharge: 2015-12-31 | Disposition: A | Discharge status: Home / Self Care | Payer: Medicare Other | Attending: Family Medicine | Admitting: Family Medicine

## 2015-12-31 DIAGNOSIS — B354 Tinea corporis: Secondary | ICD-10-CM | POA: Diagnosis not present

## 2015-12-31 DIAGNOSIS — L509 Urticaria, unspecified: Secondary | ICD-10-CM

## 2015-12-31 MED ORDER — FLUCONAZOLE 150 MG PO TABS: 150.0000 mg | ORAL_TABLET | Freq: Every day | ORAL | 0 refills | Status: DC

## 2015-12-31 MED ORDER — PREDNISONE 20 MG PO TABS: ORAL_TABLET | ORAL | 0 refills | Status: DC

## 2015-12-31 NOTE — Discharge Instructions (Signed)
Take zyrtec 10mg  one a day Zantac 150mg  one a day

## 2015-12-31 NOTE — ED Triage Notes (Signed)
Per Patient was seen x 2 weeks for hives and was given prednisone. Per patient after her last dose the hives return.

## 2015-12-31 NOTE — ED Provider Notes (Signed)
[facility]    CSN: [id number] Arrival date & time: 12/31/15  0802     History   Chief Complaint Chief Complaint  Patient presents with  . Rash    HPI Haley Mooney is a 74 y.o. female.   The history is provided by the patient.  Rash  Location:  Torso and shoulder/arm Shoulder/arm rash location:  L upper arm, R upper arm, L forearm and R forearm Torso rash location:  Upper back, lower back, L chest and R chest Quality: itchiness   Severity:  Moderate Onset quality:  Sudden Duration:  2 weeks Timing:  Intermittent Progression:  Waxing and waning Chronicity:  Recurrent Context: medications (patient states started HCTZ about 1 month ago)   Context: not animal contact, not chemical exposure, not diapers, not eggs, not exposure to similar rash, not food, not hot tub use, not insect bite/sting, not new detergent/soap, not nuts, not plant contact, not pollen, not pregnancy, not sick contacts and not sun exposure   Relieved by: took a course of prednisone about 2 week ago and resolved but symptoms recurred. Associated symptoms: no abdominal pain, no diarrhea, no fatigue, no fever, no headaches, no hoarse voice, no induration, no joint pain, no myalgias, no nausea, no periorbital edema, no shortness of breath, no sore throat, no throat swelling, no tongue swelling, no URI, not vomiting and not wheezing     Past Medical History:  Diagnosis Date  . Anxiety   . Arthritis   . Bipolar disorder ([place])   . CAD (coronary artery disease)   . Cancer ([place])    SKIN  . Depression   . Diabetes mellitus without complication ([place])   . GERD (gastroesophageal reflux disease)    H/O  . Heart murmur   . Hypertension   . Neuromuscular disorder ([place])    NEUROPATHY  . Pain    CHEST OCC WITH EXERTION  . Pneumonia    IN PAST  . Shortness of breath dyspnea     There are no active problems to display for this patient.   Past Surgical History:  Procedure Laterality Date  .  ABDOMINAL HYSTERECTOMY    . APPENDECTOMY    . BLADDER SUSPENSION    . CATARACT EXTRACTION Left   . CATARACT EXTRACTION W/PHACO Left 08/08/2015   Procedure: CATARACT EXTRACTION PHACO AND INTRAOCULAR LENS PLACEMENT (IOC);  Surgeon: Birder Robson, MD;  Location: [facility] ORS;  Service: Ophthalmology;  Laterality: Left;  [place] 00:55 AP% 17.1 CDE 9.48 fluid pack lot # [id number]  . CATARACT EXTRACTION W/PHACO Right 09/05/2015   Procedure: CATARACT EXTRACTION PHACO AND INTRAOCULAR LENS PLACEMENT (IOC);  Surgeon: Birder Robson, MD;  Location: [facility] ORS;  Service: Ophthalmology;  Laterality: Right;  Lot# [id number] H US:00:43.0 AP%: 20.3 CDE: 8.74  . COLONOSCOPY    . CORONARY ANGIOPLASTY WITH STENT PLACEMENT     CATH DONE [facility] 6/17 DR PAULA MILLER  . EYE SURGERY    . JOINT REPLACEMENT    . TONSILLECTOMY      OB History    No data available       Home Medications    Prior to Admission medications   Medication Sig Start Date End Date Taking? Authorizing Provider  albuterol (PROVENTIL HFA;VENTOLIN HFA) 108 (90 Base) MCG/ACT inhaler Inhale 2 puffs into the lungs every 6 (six) hours as needed for wheezing or shortness of breath.   Yes Historical Provider, MD  ARIPiprazole (ABILIFY) 5 MG tablet Take 5 mg by mouth daily.   Yes  Historical Provider, MD  aspirin 81 MG tablet Take 81 mg by mouth daily.   Yes Historical Provider, MD  atorvastatin (LIPITOR) 80 MG tablet Take 80 mg by mouth daily at 6 PM.   Yes Historical Provider, MD  clonazePAM (KLONOPIN) 0.5 MG tablet Take 0.5 mg by mouth 3 (three) times daily as needed for anxiety.   Yes Historical Provider, MD  Garcinia Cambogia-Chromium 500-200 MG-MCG TABS Take 1 tablet by mouth 2 (two) times daily.   Yes Historical Provider, MD  glipiZIDE (GLUCOTROL) 10 MG tablet Take 10 mg by mouth 2 (two) times daily before a meal.   Yes Historical Provider, MD  hydrochlorothiazide (HYDRODIURIL) 25 MG tablet Take 25 mg by mouth daily.   Yes Historical Provider, MD    ibuprofen (ADVIL,MOTRIN) 200 MG tablet Take 200 mg by mouth every 6 (six) hours as needed.   Yes Historical Provider, MD  insulin detemir (LEVEMIR) 100 UNIT/ML injection Inject 20 Units into the skin at bedtime.   Yes Historical Provider, MD  insulin lispro (HUMALOG) 100 UNIT/ML injection Inject into the skin 3 (three) times daily before meals.   Yes Historical Provider, MD  Melatonin 10 MG CAPS Take 10 mg by mouth at bedtime.   Yes Historical Provider, MD  metFORMIN (GLUMETZA) 1000 MG (MOD) 24 hr tablet Take 500 mg by mouth 2 (two) times daily with a meal.    Yes Historical Provider, MD  metoprolol succinate (TOPROL-XL) 50 MG 24 hr tablet Take 50 mg by mouth daily. Take with or immediately following a meal.   Yes Historical Provider, MD  mupirocin ointment (BACTROBAN) 2 % Apply 1 application topically 3 (three) times daily. 08/15/15  Yes Lorin Picket, PA-C  nitroGLYCERIN (NITROSTAT) 0.4 MG SL tablet Place 0.4 mg under the tongue every 5 (five) minutes as needed for chest pain.   Yes Historical Provider, MD  nystatin-triamcinolone (MYCOLOG II) cream Apply to affected area daily 12/23/15  Yes Melanee Left, NP  oxyCODONE (ROXICODONE) 15 MG immediate release tablet Take 5 mg by mouth every 4 (four) hours as needed for pain.   Yes Historical Provider, MD  QUEtiapine (SEROQUEL) 50 MG tablet Take 50 mg by mouth at bedtime.   Yes Historical Provider, MD  sitaGLIPtin (JANUVIA) 100 MG tablet Take 100 mg by mouth daily.   Yes Historical Provider, MD  venlafaxine XR (EFFEXOR-XR) 150 MG 24 hr capsule Take 150 mg by mouth daily with breakfast.   Yes Historical Provider, MD  fluconazole (DIFLUCAN) 150 MG tablet Take 1 tablet (150 mg total) by mouth daily. 12/31/15   Norval Gable, MD  predniSONE (DELTASONE) 20 MG tablet 3 tabs po once day 1, then 2 tabs po qd for 2 days, then 1 tab po qd for 2 days, then half a tab po qd 2 days 12/31/15   Norval Gable, MD    Family History Family History  Problem  Relation Age of Onset  . Heart failure Father     Social History Social History  Substance Use Topics  . Smoking status: Former Smoker    Types: E-cigarettes  . Smokeless tobacco: Current User  . Alcohol use Yes     Comment: OCCAS     Allergies   Ace inhibitors and Tetracyclines & related   Review of Systems Review of Systems  Constitutional: Negative for fatigue and fever.  HENT: Negative for hoarse voice and sore throat.   Respiratory: Negative for shortness of breath and wheezing.   Gastrointestinal: Negative for abdominal  pain, diarrhea, nausea and vomiting.  Musculoskeletal: Negative for arthralgias and myalgias.  Skin: Positive for rash.  Neurological: Negative for headaches.     Physical Exam Triage Vital Signs ED Triage Vitals  Enc Vitals Group     BP 12/31/15 0816 129/65     Pulse Rate 12/31/15 0816 93     Resp 12/31/15 0816 16     Temp 12/31/15 0816 97.8 F (36.6 C)     Temp Source 12/31/15 0816 Oral     SpO2 12/31/15 0816 100 %     Weight 12/31/15 0818 227 lb (103 kg)     Height --      Head Circumference --      Peak Flow --      Pain Score 12/31/15 0820 2     Pain Loc --      Pain Edu? --      Excl. in [place]? --    No data found.   Updated Vital Signs BP 129/65 (BP Location: Left Arm)   Pulse 93   Temp 97.8 F (36.6 C) (Oral)   Resp 16   Wt 227 lb (103 kg)   SpO2 100%   BMI 38.96 kg/m   Visual Acuity Right Eye Distance:   Left Eye Distance:   Bilateral Distance:    Right Eye Near:   Left Eye Near:    Bilateral Near:     Physical Exam  Constitutional: She appears well-developed and well-nourished. No distress.  Skin: Rash noted. Rash is urticarial. She is not diaphoretic. There is erythema.     Nursing note and vitals reviewed.    UC Treatments / Results  Labs (all labs ordered are listed, but only abnormal results are displayed) Labs Reviewed - No data to display  EKG  EKG Interpretation None       Radiology No  results found.  Procedures Procedures (including critical care time)  Medications Ordered in UC Medications - No data to display   Initial Impression / Assessment and Plan / UC Course  I have reviewed the triage vital signs and the nursing notes.  Pertinent labs & imaging results that were available during my care of the patient were reviewed by me and considered in my medical decision making (see chart for details).  Clinical Course       Final Clinical Impressions(s) / UC Diagnoses   Final diagnoses:  Hives  Tinea corporis    New Prescriptions Discharge Medication List as of 12/31/2015  9:06 AM    START taking these medications   Details  fluconazole (DIFLUCAN) 150 MG tablet Take 1 tablet (150 mg total) by mouth daily., Starting Sun 12/31/2015, Normal    predniSONE (DELTASONE) 20 MG tablet 3 tabs po once day 1, then 2 tabs po qd for 2 days, then 1 tab po qd for 2 days, then half a tab po qd 2 days, Normal       1. diagnosis reviewed with patient 2. rx as per orders above; reviewed possible side effects, interactions, risks and benefits  3. Recommend supportive treatment with otc antihistamines, H2 blockers 4. Follow-up prn if symptoms worsen or don't improve   Norval Gable, MD 12/31/15 [phone]

## 2016-05-26 ENCOUNTER — Ambulatory Visit: Payer: Medicare Other

## 2016-05-26 ENCOUNTER — Encounter: Payer: Self-pay | Admitting: Emergency Medicine

## 2016-05-26 ENCOUNTER — Ambulatory Visit
Admission: EM | Admit: 2016-05-26 | Discharge: 2016-05-26 | Disposition: A | Discharge status: Home / Self Care | Payer: Medicare Other | Attending: Family Medicine | Admitting: Family Medicine

## 2016-05-26 DIAGNOSIS — R0602 Shortness of breath: Secondary | ICD-10-CM

## 2016-05-26 DIAGNOSIS — Z87891 Personal history of nicotine dependence: Secondary | ICD-10-CM | POA: Diagnosis not present

## 2016-05-26 DIAGNOSIS — R05 Cough: Secondary | ICD-10-CM

## 2016-05-26 DIAGNOSIS — K219 Gastro-esophageal reflux disease without esophagitis: Secondary | ICD-10-CM | POA: Diagnosis not present

## 2016-05-26 DIAGNOSIS — I10 Essential (primary) hypertension: Secondary | ICD-10-CM | POA: Insufficient documentation

## 2016-05-26 DIAGNOSIS — J209 Acute bronchitis, unspecified: Secondary | ICD-10-CM | POA: Diagnosis present

## 2016-05-26 DIAGNOSIS — I2510 Atherosclerotic heart disease of native coronary artery without angina pectoris: Secondary | ICD-10-CM | POA: Insufficient documentation

## 2016-05-26 MED ORDER — OLOPATADINE HCL 0.2 % OP SOLN: OPHTHALMIC | 0 refills | Status: AC

## 2016-05-26 MED ORDER — PREDNISONE 50 MG PO TABS: ORAL_TABLET | ORAL | 0 refills | Status: DC

## 2016-05-26 MED ORDER — AMOXICILLIN-POT CLAVULANATE 875-125 MG PO TABS: 1.0000 | ORAL_TABLET | Freq: Two times a day (BID) | ORAL | 0 refills | Status: DC

## 2016-05-26 NOTE — ED Triage Notes (Signed)
Patient c/o cough and chest congestion for 3 weeks.  Patient states that she had a round of Z-Pack 3 weeks ago.

## 2016-05-26 NOTE — Discharge Instructions (Signed)
Medications as prescribed. ° °Take care ° °Dr. Elvera Almario  °

## 2016-05-26 NOTE — ED Provider Notes (Signed)
[facility]    CSN: [id number] Arrival date & time: 05/26/16  2993  History   Chief Complaint Chief Complaint  Patient presents with  . Cough   HPI  76 year old female with a complicated past medical history including CAD, uncontrolled diabetes, former smoker who presents with the above complaint.  Patient states that she went to [place] approximately 3 weeks ago. At that time she developed respiratory symptoms. She was treated with azithromycin. Upon return to [place] she worsened. She's had productive cough. Sputum is green. She's had associated wheezing and shortness of breath. No fever. No known exacerbating or relieving factors. She also reports nasal discharge. No other associated symptoms. No other complaints or concerns at this time.  Of note, she states that she has never been told she has COPD. She does report a history of bronchitis. She smoked for 45 years.  Past Medical History:  Diagnosis Date  . Anxiety   . Arthritis   . Bipolar disorder ([place])   . CAD (coronary artery disease)   . Cancer ([place])    SKIN  . Depression   . Diabetes mellitus without complication ([place])   . GERD (gastroesophageal reflux disease)    H/O  . Heart murmur   . Hypertension   . Neuromuscular disorder ([place])    NEUROPATHY  . Pain    CHEST OCC WITH EXERTION  . Pneumonia    IN PAST  . Shortness of breath dyspnea     Past Surgical History:  Procedure Laterality Date  . ABDOMINAL HYSTERECTOMY    . APPENDECTOMY    . BLADDER SUSPENSION    . CATARACT EXTRACTION Left   . CATARACT EXTRACTION W/PHACO Left 08/08/2015   Procedure: CATARACT EXTRACTION PHACO AND INTRAOCULAR LENS PLACEMENT (IOC);  Surgeon: Birder Robson, MD;  Location: [facility] ORS;  Service: Ophthalmology;  Laterality: Left;  [place] 00:55 AP% 17.1 CDE 9.48 fluid pack lot # [id number]  . CATARACT EXTRACTION W/PHACO Right 09/05/2015   Procedure: CATARACT EXTRACTION PHACO AND INTRAOCULAR LENS PLACEMENT (IOC);   Surgeon: Birder Robson, MD;  Location: [facility] ORS;  Service: Ophthalmology;  Laterality: Right;  Lot# [id number] H US:00:43.0 AP%: 20.3 CDE: 8.74  . COLONOSCOPY    . CORONARY ANGIOPLASTY WITH STENT PLACEMENT     CATH DONE [facility] 6/17 DR PAULA MILLER  . EYE SURGERY    . JOINT REPLACEMENT    . TONSILLECTOMY      OB History    No data available     Home Medications    Prior to Admission medications   Medication Sig Start Date End Date Taking? Authorizing Provider  albuterol (PROVENTIL HFA;VENTOLIN HFA) 108 (90 Base) MCG/ACT inhaler Inhale 2 puffs into the lungs every 6 (six) hours as needed for wheezing or shortness of breath.    [provider]  amoxicillin-clavulanate (AUGMENTIN) 875-125 MG tablet Take 1 tablet by mouth every 12 (twelve) hours. 05/26/16   Coral Spikes, DO  ARIPiprazole (ABILIFY) 5 MG tablet Take 5 mg by mouth daily.    [provider]  aspirin 81 MG tablet Take 81 mg by mouth daily.    [provider]  atorvastatin (LIPITOR) 80 MG tablet Take 80 mg by mouth daily at 6 PM.    [provider]  clonazePAM (KLONOPIN) 0.5 MG tablet Take 0.5 mg by mouth 3 (three) times daily as needed for anxiety.    [provider]  Garcinia Cambogia-Chromium 500-200 MG-MCG TABS Take 1 tablet by mouth 2 (two) times daily.  [provider]  glipiZIDE (GLUCOTROL) 10 MG tablet Take 10 mg by mouth 2 (two) times daily before a meal.    [provider]  hydrochlorothiazide (HYDRODIURIL) 25 MG tablet Take 25 mg by mouth daily.    [provider]  insulin detemir (LEVEMIR) 100 UNIT/ML injection Inject 40 Units into the skin daily.     [provider]  Melatonin 10 MG CAPS Take 10 mg by mouth at bedtime.    [provider]  metFORMIN (GLUMETZA) 1000 MG (MOD) 24 hr tablet Take 1,000 mg by mouth 2 (two) times daily with a meal.     [provider]  metoprolol succinate (TOPROL-XL) 50 MG 24 hr tablet Take 50  mg by mouth daily. Take with or immediately following a meal.    [provider]  nitroGLYCERIN (NITROSTAT) 0.4 MG SL tablet Place 0.4 mg under the tongue every 5 (five) minutes as needed for chest pain.    [provider]  nystatin-triamcinolone Lilyan Gilford II) cream Apply to affected area daily 12/23/15   Morley Kos A, NP  Olopatadine HCl 0.2 % SOLN 1 drop to affected eye daily. 05/26/16   Coral Spikes, DO  predniSONE (DELTASONE) 50 MG tablet 1 tablet daily x 5 days 05/26/16   Coral Spikes, DO  QUEtiapine (SEROQUEL) 50 MG tablet Take 50 mg by mouth at bedtime.    [provider]  sitaGLIPtin (JANUVIA) 100 MG tablet Take 100 mg by mouth daily.    [provider]  venlafaxine XR (EFFEXOR-XR) 150 MG 24 hr capsule Take 150 mg by mouth daily with breakfast.    [provider]    Family History Family History  Problem Relation Age of Onset  . Heart failure Father     Social History Social History  Substance Use Topics  . Smoking status: Former Smoker    Types: E-cigarettes  . Smokeless tobacco: Current User  . Alcohol use Yes     Comment: OCCAS     Allergies   Ace inhibitors and Tetracyclines & related   Review of Systems Review of Systems  Constitutional: Negative for fever.  Respiratory: Positive for cough, shortness of breath and wheezing.    Physical Exam Triage Vital Signs ED Triage Vitals [05/26/16 0857]  Enc Vitals Group     BP      Pulse      Resp      Temp      Temp src      SpO2      Weight 220 lb (99.8 kg)     Height 5\' 4"  (1.626 m)     Head Circumference      Peak Flow      Pain Score 0     Pain Loc      Pain Edu?      Excl. in [place]?    Updated Vital Signs BP 127/79 (BP Location: Right Arm)   Pulse 65   Temp 98.8 F (37.1 C) (Oral)   Resp 16   Ht 5\' 4"  (1.626 m)   Wt 220 lb (99.8 kg)   SpO2 97%   BMI 37.76 kg/m    Physical Exam  Constitutional: She is oriented to person, place, and time. She  appears well-developed. No distress.  HENT:  Head: Normocephalic and atraumatic.  Mouth/Throat: Oropharynx is clear and moist.  Eyes:  Right eye with conjunctival injection. No drainage.  Cardiovascular: Normal rate and regular rhythm.   Pulmonary/Chest: Effort normal.  Diffuse expiratory wheezing.  Neurological: She is alert and oriented to person, place, and time.  Psychiatric: She has a normal mood and affect.  Vitals reviewed.  UC Treatments / Results  Labs (all labs ordered are listed, but only abnormal results are displayed) Labs Reviewed - No data to display  EKG  EKG Interpretation None       Radiology Dg Chest 2 View  Result Date: 05/26/2016 CLINICAL DATA:  Cough EXAM: CHEST  2 VIEW COMPARISON:  None. FINDINGS: Heart and mediastinal contours are within normal limits. No focal opacities or effusions. No acute bony abnormality. IMPRESSION: No active cardiopulmonary disease. Electronically Signed   By: Rolm Baptise M.D.   On: 05/26/2016 09:39    Procedures Procedures (including critical care time)  Medications Ordered in UC Medications - No data to display   Initial Impression / Assessment and Plan / UC Course  I have reviewed the triage vital signs and the nursing notes.  Pertinent labs & imaging results that were available during my care of the patient were reviewed by me and considered in my medical decision making (see chart for details).   76 year old female presents with productive cough, wheezing, shortness of breath. Given her history tobacco abuse I feel that this is most consistent with a COPD exacerbation. I have designated this to be acute bronchitis given the fact that she has not had primary function tests to confirm this. Treating with prednisone and Augmentin. Additionally, patient had some conjunctival injection of her right eye noted on exam. No drainage. Pataday given for this.  Final Clinical Impressions(s) / UC Diagnoses   Final diagnoses:    Acute bronchitis, unspecified organism    New Prescriptions Discharge Medication List as of 05/26/2016  9:48 AM    START taking these medications   Details  amoxicillin-clavulanate (AUGMENTIN) 875-125 MG tablet Take 1 tablet by mouth every 12 (twelve) hours., Starting Sun 05/26/2016, Normal    Olopatadine HCl 0.2 % SOLN 1 drop to affected eye daily., Normal    predniSONE (DELTASONE) 50 MG tablet 1 tablet daily x 5 days, Normal         Coral Spikes, [place] 05/26/16 [phone]

## 2017-08-21 ENCOUNTER — Encounter: Payer: Self-pay | Admitting: Emergency Medicine

## 2017-08-21 ENCOUNTER — Inpatient Hospital Stay
Admission: EM | Admit: 2017-08-21 | Discharge: 2017-08-25 | DRG: 066 | Disposition: A | Discharge status: Skilled Nursing Facility | Payer: Medicare Other | Attending: Internal Medicine | Admitting: Internal Medicine

## 2017-08-21 ENCOUNTER — Emergency Department: Payer: Medicare Other

## 2017-08-21 ENCOUNTER — Other Ambulatory Visit: Payer: Self-pay

## 2017-08-21 DIAGNOSIS — I10 Essential (primary) hypertension: Secondary | ICD-10-CM | POA: Diagnosis present

## 2017-08-21 DIAGNOSIS — I2510 Atherosclerotic heart disease of native coronary artery without angina pectoris: Secondary | ICD-10-CM | POA: Diagnosis present

## 2017-08-21 DIAGNOSIS — Z794 Long term (current) use of insulin: Secondary | ICD-10-CM

## 2017-08-21 DIAGNOSIS — K219 Gastro-esophageal reflux disease without esophagitis: Secondary | ICD-10-CM | POA: Diagnosis present

## 2017-08-21 DIAGNOSIS — E860 Dehydration: Secondary | ICD-10-CM | POA: Diagnosis present

## 2017-08-21 DIAGNOSIS — Z7952 Long term (current) use of systemic steroids: Secondary | ICD-10-CM

## 2017-08-21 DIAGNOSIS — H538 Other visual disturbances: Secondary | ICD-10-CM | POA: Diagnosis present

## 2017-08-21 DIAGNOSIS — Z90710 Acquired absence of both cervix and uterus: Secondary | ICD-10-CM | POA: Diagnosis not present

## 2017-08-21 DIAGNOSIS — E1140 Type 2 diabetes mellitus with diabetic neuropathy, unspecified: Secondary | ICD-10-CM | POA: Diagnosis present

## 2017-08-21 DIAGNOSIS — E785 Hyperlipidemia, unspecified: Secondary | ICD-10-CM | POA: Diagnosis present

## 2017-08-21 DIAGNOSIS — Z85828 Personal history of other malignant neoplasm of skin: Secondary | ICD-10-CM | POA: Diagnosis not present

## 2017-08-21 DIAGNOSIS — Z66 Do not resuscitate: Secondary | ICD-10-CM | POA: Diagnosis present

## 2017-08-21 DIAGNOSIS — F319 Bipolar disorder, unspecified: Secondary | ICD-10-CM | POA: Diagnosis present

## 2017-08-21 DIAGNOSIS — R29700 NIHSS score 0: Secondary | ICD-10-CM | POA: Diagnosis present

## 2017-08-21 DIAGNOSIS — Z87891 Personal history of nicotine dependence: Secondary | ICD-10-CM

## 2017-08-21 DIAGNOSIS — Z7982 Long term (current) use of aspirin: Secondary | ICD-10-CM

## 2017-08-21 DIAGNOSIS — I639 Cerebral infarction, unspecified: Principal | ICD-10-CM | POA: Diagnosis present

## 2017-08-21 DIAGNOSIS — I5030 Unspecified diastolic (congestive) heart failure: Secondary | ICD-10-CM | POA: Diagnosis not present

## 2017-08-21 DIAGNOSIS — Z955 Presence of coronary angioplasty implant and graft: Secondary | ICD-10-CM

## 2017-08-21 LAB — URINALYSIS, COMPLETE (UACMP) WITH MICROSCOPIC
BACTERIA UA: NONE SEEN
BILIRUBIN URINE: NEGATIVE
Hgb urine dipstick: NEGATIVE
KETONES UR: 20 mg/dL — AB
LEUKOCYTES UA: NEGATIVE
NITRITE: NEGATIVE
PH: 5 (ref 5.0–8.0)
PROTEIN: NEGATIVE mg/dL
Specific Gravity, Urine: 1.024 (ref 1.005–1.030)

## 2017-08-21 LAB — COMPREHENSIVE METABOLIC PANEL
ALT: 27 U/L (ref 0–44)
ANION GAP: 10 (ref 5–15)
AST: 29 U/L (ref 15–41)
Albumin: 3.9 g/dL (ref 3.5–5.0)
Alkaline Phosphatase: 102 U/L (ref 38–126)
BILIRUBIN TOTAL: 0.5 mg/dL (ref 0.3–1.2)
BUN: 21 mg/dL (ref 8–23)
CO2: 22 mmol/L (ref 22–32)
Calcium: 8.8 mg/dL — ABNORMAL LOW (ref 8.9–10.3)
Chloride: 106 mmol/L (ref 98–111)
Creatinine, Ser: 0.5 mg/dL (ref 0.44–1.00)
Glucose, Bld: 305 mg/dL — ABNORMAL HIGH (ref 70–99)
POTASSIUM: 3.7 mmol/L (ref 3.5–5.1)
Sodium: 138 mmol/L (ref 135–145)
TOTAL PROTEIN: 7 g/dL (ref 6.5–8.1)

## 2017-08-21 LAB — CBC
HEMATOCRIT: 37.6 % (ref 35.0–47.0)
Hemoglobin: 12.5 g/dL (ref 12.0–16.0)
MCH: 27.6 pg (ref 26.0–34.0)
MCHC: 33.1 g/dL (ref 32.0–36.0)
MCV: 83.2 fL (ref 80.0–100.0)
Platelets: 219 10*3/uL (ref 150–440)
RBC: 4.52 MIL/uL (ref 3.80–5.20)
RDW: 14.5 % (ref 11.5–14.5)
WBC: 13.6 10*3/uL — AB (ref 3.6–11.0)

## 2017-08-21 LAB — LIPASE, BLOOD: LIPASE: 23 U/L (ref 11–51)

## 2017-08-21 LAB — GLUCOSE, CAPILLARY
GLUCOSE-CAPILLARY: 246 mg/dL — AB (ref 70–99)
GLUCOSE-CAPILLARY: 230 mg/dL — AB (ref 70–99)
Glucose-Capillary: 165 mg/dL — ABNORMAL HIGH (ref 70–99)

## 2017-08-21 LAB — TROPONIN I: Troponin I: <0.03 ng/mL (ref <0.03)

## 2017-08-21 MED ORDER — ACETAMINOPHEN 325 MG PO TABS
650.0000 mg | ORAL_TABLET | ORAL | Status: DC | PRN
  Administered 2017-08-21 – 2017-08-22 (×3): 650 mg via ORAL
  Filled 2017-08-21 (×3): qty 2

## 2017-08-21 MED ORDER — ASPIRIN EC 325 MG PO TBEC
325.0000 mg | DELAYED_RELEASE_TABLET | Freq: Every day | ORAL | Status: DC
  Administered 2017-08-22 – 2017-08-25 (×4): 325 mg via ORAL
  Filled 2017-08-21 (×6): qty 1

## 2017-08-21 MED ORDER — INSULIN ASPART 100 UNIT/ML ~~LOC~~ SOLN
4.0000 [IU] | Freq: Once | SUBCUTANEOUS | Status: AC
  Administered 2017-08-21: 4 [IU] via SUBCUTANEOUS
  Filled 2017-08-21: qty 1

## 2017-08-21 MED ORDER — ENOXAPARIN SODIUM 40 MG/0.4ML ~~LOC~~ SOLN
40.0000 mg | Freq: Two times a day (BID) | SUBCUTANEOUS | Status: DC
  Administered 2017-08-21 – 2017-08-25 (×8): 40 mg via SUBCUTANEOUS
  Filled 2017-08-21 (×9): qty 0.4

## 2017-08-21 MED ORDER — MECLIZINE HCL 25 MG PO TABS
12.5000 mg | ORAL_TABLET | ORAL | Status: AC
  Administered 2017-08-21: 12.5 mg via ORAL
  Filled 2017-08-21: qty 1

## 2017-08-21 MED ORDER — ATORVASTATIN CALCIUM 20 MG PO TABS
80.0000 mg | ORAL_TABLET | Freq: Every day | ORAL | Status: DC
  Administered 2017-08-22 – 2017-08-24 (×3): 80 mg via ORAL
  Filled 2017-08-21 (×4): qty 4

## 2017-08-21 MED ORDER — QUETIAPINE FUMARATE 25 MG PO TABS
25.0000 mg | ORAL_TABLET | Freq: Every day | ORAL | Status: DC
  Administered 2017-08-21 – 2017-08-24 (×4): 25 mg via ORAL
  Filled 2017-08-21 (×4): qty 1

## 2017-08-21 MED ORDER — SODIUM CHLORIDE 0.9 % IV BOLUS
500.0000 mL | Freq: Once | INTRAVENOUS | Status: AC
  Administered 2017-08-21: 500 mL via INTRAVENOUS

## 2017-08-21 MED ORDER — INSULIN ASPART 100 UNIT/ML ~~LOC~~ SOLN
0.0000 [IU] | Freq: Every day | SUBCUTANEOUS | Status: DC
  Administered 2017-08-24: 3 [IU] via SUBCUTANEOUS
  Filled 2017-08-21: qty 1

## 2017-08-21 MED ORDER — GABAPENTIN 300 MG PO CAPS
300.0000 mg | ORAL_CAPSULE | Freq: Every day | ORAL | Status: DC
  Administered 2017-08-23 – 2017-08-25 (×3): 300 mg via ORAL
  Filled 2017-08-21 (×6): qty 1

## 2017-08-21 MED ORDER — ONDANSETRON HCL 4 MG/2ML IJ SOLN
4.0000 mg | Freq: Four times a day (QID) | INTRAMUSCULAR | Status: DC | PRN
  Administered 2017-08-22: 18:00:00 4 mg via INTRAVENOUS
  Filled 2017-08-21: qty 2

## 2017-08-21 MED ORDER — ASPIRIN 300 MG RE SUPP
300.0000 mg | Freq: Every day | RECTAL | Status: DC
  Filled 2017-08-21 (×3): qty 1

## 2017-08-21 MED ORDER — MECLIZINE HCL 25 MG PO TABS
12.5000 mg | ORAL_TABLET | Freq: Once | ORAL | Status: AC
  Administered 2017-08-21: 12.5 mg via ORAL
  Filled 2017-08-21: qty 1

## 2017-08-21 MED ORDER — STROKE: EARLY STAGES OF RECOVERY BOOK
Freq: Once | Status: AC
  Administered 2017-08-21

## 2017-08-21 MED ORDER — ACETAMINOPHEN 650 MG RE SUPP: 650.0000 mg | RECTAL | Status: DC | PRN

## 2017-08-21 MED ORDER — ASPIRIN 81 MG PO CHEW
324.0000 mg | CHEWABLE_TABLET | Freq: Once | ORAL | Status: AC
  Administered 2017-08-21: 324 mg via ORAL
  Filled 2017-08-21: qty 4

## 2017-08-21 MED ORDER — SODIUM CHLORIDE 0.9 % IV SOLN
INTRAVENOUS | Status: DC
  Administered 2017-08-21 – 2017-08-22 (×2): via INTRAVENOUS

## 2017-08-21 MED ORDER — NITROGLYCERIN 0.4 MG SL SUBL: 0.4000 mg | SUBLINGUAL_TABLET | SUBLINGUAL | Status: DC | PRN

## 2017-08-21 MED ORDER — INSULIN ASPART 100 UNIT/ML ~~LOC~~ SOLN
0.0000 [IU] | Freq: Three times a day (TID) | SUBCUTANEOUS | Status: DC
  Administered 2017-08-22: 13:00:00 1 [IU] via SUBCUTANEOUS
  Administered 2017-08-22 – 2017-08-23 (×3): 2 [IU] via SUBCUTANEOUS
  Administered 2017-08-23: 1 [IU] via SUBCUTANEOUS
  Administered 2017-08-23: 13:00:00 3 [IU] via SUBCUTANEOUS
  Administered 2017-08-24: 2 [IU] via SUBCUTANEOUS
  Administered 2017-08-24: 14:00:00 3 [IU] via SUBCUTANEOUS
  Administered 2017-08-24: 5 [IU] via SUBCUTANEOUS
  Administered 2017-08-25: 3 [IU] via SUBCUTANEOUS
  Administered 2017-08-25: 09:00:00 2 [IU] via SUBCUTANEOUS
  Filled 2017-08-21 (×11): qty 1

## 2017-08-21 MED ORDER — ALBUTEROL SULFATE (2.5 MG/3ML) 0.083% IN NEBU
3.0000 mL | INHALATION_SOLUTION | Freq: Four times a day (QID) | RESPIRATORY_TRACT | Status: DC | PRN
Start: 2017-08-21 — End: 2017-08-25

## 2017-08-21 MED ORDER — CLONAZEPAM 0.5 MG PO TABS
0.5000 mg | ORAL_TABLET | Freq: Three times a day (TID) | ORAL | Status: DC | PRN
  Administered 2017-08-22 – 2017-08-24 (×3): 0.5 mg via ORAL
  Filled 2017-08-21 (×3): qty 1

## 2017-08-21 MED ORDER — ARIPIPRAZOLE 5 MG PO TABS
2.5000 mg | ORAL_TABLET | Freq: Every day | ORAL | Status: DC
Start: 2017-08-22 — End: 2017-08-25
  Administered 2017-08-23 – 2017-08-25 (×3): 2.5 mg via ORAL
  Filled 2017-08-21 (×5): qty 1

## 2017-08-21 MED ORDER — ACETAMINOPHEN 160 MG/5ML PO SOLN
650.0000 mg | ORAL | Status: DC | PRN
Start: 2017-08-21 — End: 2017-08-25
  Filled 2017-08-21: qty 20.3

## 2017-08-21 NOTE — ED Notes (Signed)
Patient transported to X-ray 

## 2017-08-21 NOTE — Progress Notes (Addendum)
Advanced care plan.  Purpose of the Encounter: CODE STATUS  Parties in Attendance:Patient  Patient's Decision Capacity:Good  Subjective/Patient's story: Presented for dizziness, vomiting  Objective/Medical story Has cva on imaging studies mri brain Needs work up  Goals of care determination:  Advance care directives and goals of care discussed with the patient Patient does not want any CPR and intubation / ventilator  CODE STATUS: DNR  Time spent discussing advanced care planning: 16 minutes

## 2017-08-21 NOTE — ED Provider Notes (Addendum)
[facility] Emergency Department Provider Note   ____________________________________________   First MD Initiated Contact with Patient 08/21/17 1630     (approximate)  I have reviewed the triage vital signs and the nursing notes.   HISTORY  Chief Complaint Nausea; Emesis; and Weakness    HPI Haley Mooney is a 77 y.o. female here for evaluation of dizziness and vomiting  Patient reports she had the same symptoms about 2 weeks ago and was admitted overnight for observation at [facility] diagnosed with vertigo-like symptoms.  She had neck surgery a couple of months ago, and then about 2 weeks ago she had sudden onset of feeling very dizzy spinning sensation nausea and went to [facility] and was admitted for observation.  She also notes they sent her home with a heart monitor and they did have that evaluated by cardiology and she heard that there was may be 1 brief pause on it, but her cardiologist reported to her they were not at all concerned about it.  Symptoms today started about 8 AM.  Today when she got up she got up out of bed and began experiencing sudden onset of dizziness this feels like a spinning sensation with very severe nausea that caused her to vomit multiple times with retching about 10 times.  She also had one somewhat loose stool.  Denies abdominal pain.  No chest pain or trouble breathing at all.  She had a slight dry cough for about 2 weeks.  No fevers.  She does not feel lightheaded per se as much as she feels like she gets very dizzy like the room is spinning.  Past Medical History:  Diagnosis Date  . Anxiety   . Arthritis   . Bipolar disorder ([place])   . CAD (coronary artery disease)   . Cancer ([place])    SKIN  . Depression   . Diabetes mellitus without complication ([place])   . GERD (gastroesophageal reflux disease)    H/O  . Heart murmur   . Hypertension   . Neuromuscular disorder ([place])    NEUROPATHY  . Pain    CHEST  OCC WITH EXERTION  . Pneumonia    IN PAST  . Shortness of breath dyspnea     There are no active problems to display for this patient.   Past Surgical History:  Procedure Laterality Date  . ABDOMINAL HYSTERECTOMY    . APPENDECTOMY    . BACK SURGERY    . BLADDER SUSPENSION    . CATARACT EXTRACTION Left   . CATARACT EXTRACTION W/PHACO Left 08/08/2015   Procedure: CATARACT EXTRACTION PHACO AND INTRAOCULAR LENS PLACEMENT (IOC);  Surgeon: Birder Robson, MD;  Location: [facility] ORS;  Service: Ophthalmology;  Laterality: Left;  [place] 00:55 AP% 17.1 CDE 9.48 fluid pack lot # [id number]  . CATARACT EXTRACTION W/PHACO Right 09/05/2015   Procedure: CATARACT EXTRACTION PHACO AND INTRAOCULAR LENS PLACEMENT (IOC);  Surgeon: Birder Robson, MD;  Location: [facility] ORS;  Service: Ophthalmology;  Laterality: Right;  Lot# [id number] H US:00:43.0 AP%: 20.3 CDE: 8.74  . COLONOSCOPY    . CORONARY ANGIOPLASTY WITH STENT PLACEMENT     CATH DONE [facility] 6/17 DR PAULA MILLER  . EYE SURGERY    . JOINT REPLACEMENT    . TONSILLECTOMY      Prior to Admission medications   Medication Sig Start Date End Date Taking? Authorizing Provider  albuterol (PROVENTIL HFA;VENTOLIN HFA) 108 (90 Base) MCG/ACT inhaler Inhale 2 puffs into the lungs every 6 (six)  hours as needed for wheezing or shortness of breath.    [provider]  amoxicillin-clavulanate (AUGMENTIN) 875-125 MG tablet Take 1 tablet by mouth every 12 (twelve) hours. 05/26/16   Coral Spikes, DO  ARIPiprazole (ABILIFY) 5 MG tablet Take 5 mg by mouth daily.    [provider]  aspirin 81 MG tablet Take 81 mg by mouth daily.    [provider]  atorvastatin (LIPITOR) 80 MG tablet Take 80 mg by mouth daily at 6 PM.    [provider]  clonazePAM (KLONOPIN) 0.5 MG tablet Take 0.5 mg by mouth 3 (three) times daily as needed for anxiety.    [provider]  Garcinia Cambogia-Chromium 500-200 MG-MCG TABS Take 1 tablet by mouth 2 (two)  times daily.    [provider]  glipiZIDE (GLUCOTROL) 10 MG tablet Take 10 mg by mouth 2 (two) times daily before a meal.    [provider]  hydrochlorothiazide (HYDRODIURIL) 25 MG tablet Take 25 mg by mouth daily.    [provider]  insulin detemir (LEVEMIR) 100 UNIT/ML injection Inject 40 Units into the skin daily.     [provider]  Melatonin 10 MG CAPS Take 10 mg by mouth at bedtime.    [provider]  metFORMIN (GLUMETZA) 1000 MG (MOD) 24 hr tablet Take 1,000 mg by mouth 2 (two) times daily with a meal.     [provider]  metoprolol succinate (TOPROL-XL) 50 MG 24 hr tablet Take 50 mg by mouth daily. Take with or immediately following a meal.    [provider]  nitroGLYCERIN (NITROSTAT) 0.4 MG SL tablet Place 0.4 mg under the tongue every 5 (five) minutes as needed for chest pain.    [provider]  nystatin-triamcinolone (MYCOLOG II) cream Apply to affected area daily 12/23/15   Marney Setting, NP  Olopatadine HCl 0.2 % SOLN 1 drop to affected eye daily. 05/26/16   Coral Spikes, DO  predniSONE (DELTASONE) 50 MG tablet 1 tablet daily x 5 days 05/26/16   Coral Spikes, DO  QUEtiapine (SEROQUEL) 50 MG tablet Take 50 mg by mouth at bedtime.    [provider]  sitaGLIPtin (JANUVIA) 100 MG tablet Take 100 mg by mouth daily.    [provider]  venlafaxine XR (EFFEXOR-XR) 150 MG 24 hr capsule Take 150 mg by mouth daily with breakfast.    [provider]    Allergies Ace inhibitors and Tetracyclines & related  Family History  Problem Relation Age of Onset  . Heart failure Father     Social History Social History   Tobacco Use  . Smoking status: Former Smoker    Types: E-cigarettes  . Smokeless tobacco: Current User  Substance Use Topics  . Alcohol use: Yes    Comment: OCCAS  . Drug use: No    Review of Systems Constitutional: No fever/chills Eyes: No visual  changes. ENT: No sore throat. Cardiovascular: Denies chest pain. Respiratory: Denies shortness of breath. Gastrointestinal: No abdominal pain.  No nausea, no vomiting.  No diarrhea.  No constipation. Genitourinary: Negative for dysuria. Musculoskeletal: Negative for back pain. Skin: Negative for rash. Neurological: Negative for headaches, focal weakness or numbness.   Records reviewed from [facility], of note patient has a notation of previously known prolonged QT   ____________________________________________   PHYSICAL EXAM:  VITAL SIGNS: ED Triage Vitals [08/21/17 1618]  Enc Vitals Group     BP (!) 164/91  Pulse Rate 80     Resp 20     Temp      Temp src      SpO2 93 %     Weight 237 lb (107.5 kg)     Height 5\' 4"  (1.626 m)     Head Circumference      Peak Flow      Pain Score 0     Pain Loc      Pain Edu?      Excl. in [place]?     Constitutional: Alert and oriented. Well appearing and in no acute distress.  Does appear to slightly fatigued. Eyes: Conjunctivae are normal. Head: Atraumatic.  Extraocular movements are normal.  There is no nystagmus in horizontal or vertical planes. Nose: No congestion/rhinnorhea. Mouth/Throat: Mucous membranes are moist. Neck: No stridor.   Cardiovascular: Normal rate, regular rhythm. Grossly normal heart sounds.  Good peripheral circulation. Respiratory: Normal respiratory effort.  No retractions. Lungs CTAB. Gastrointestinal: Soft and nontender. No distention. Musculoskeletal: No lower extremity tenderness nor edema. Neurologic:  Normal speech and language. No gross focal neurologic deficits are appreciated.  Normal cranial nerve exam.  5 out of 5 strength with normal sensation in all extremities and across the face.  No pronator drift in the arms or legs bilateral. Skin:  Skin is warm, dry and intact. No rash noted. Psychiatric: Mood and affect are normal. Speech and behavior are  normal.  ____________________________________________   LABS (all labs ordered are listed, but only abnormal results are displayed)  Labs Reviewed  COMPREHENSIVE METABOLIC PANEL - Abnormal; Notable for the following components:      Result Value   Glucose, Bld 305 (*)    Calcium 8.8 (*)    All other components within normal limits  CBC - Abnormal; Notable for the following components:   WBC 13.6 (*)    All other components within normal limits  URINALYSIS, COMPLETE (UACMP) WITH MICROSCOPIC - Abnormal; Notable for the following components:   Color, Urine YELLOW (*)    APPearance CLEAR (*)    Glucose, UA >=500 (*)    Ketones, ur 20 (*)    All other components within normal limits  GLUCOSE, CAPILLARY - Abnormal; Notable for the following components:   Glucose-Capillary 246 (*)    All other components within normal limits  LIPASE, BLOOD  TROPONIN I  CBG MONITORING, ED  CBG MONITORING, ED  CBG MONITORING, ED  CBG MONITORING, ED  CBG MONITORING, ED   ____________________________________________  EKG  Reviewed by me at 1630 Heart rate 89 QRS 90 QTc 425 Normal sinus rhythm, no evidence of ischemia.  Mild prolongation of the QT is noted ____________________________________________  RADIOLOGY  Dg Chest 2 View  Result Date: 08/21/2017 CLINICAL DATA:  Negative, vomiting and diarrhea. EXAM: CHEST - 2 VIEW COMPARISON:  05/26/2016 FINDINGS: Cardiomediastinal silhouette is normal. Mediastinal contours appear intact. Elevation of the right hemidiaphragm. There is no evidence of focal airspace consolidation, pleural effusion or pneumothorax. Osseous structures are without acute abnormality. Soft tissues are grossly normal. IMPRESSION: No active cardiopulmonary disease. Elevation of the right hemidiaphragm with uncertain significance. Electronically Signed   By: Fidela Salisbury M.D.   On: 08/21/2017 17:14   Mr Brain Wo Contrast  Result Date: 08/21/2017 CLINICAL DATA:  Episodic  vertigo EXAM: MRI HEAD WITHOUT CONTRAST TECHNIQUE: Multiplanar, multiecho pulse sequences of the brain and surrounding structures were obtained without intravenous contrast. COMPARISON:  None. FINDINGS: BRAIN: There is a small acute infarct of the left  paramedian pons measuring approximately 13 mm in length. No associated hemorrhage or mass effect. The midline structures are normal. There are no old infarcts. Minimal white matter hyperintensity, nonspecific and commonly seen in asymptomatic patients of this age. Generalized atrophy without lobar predilection. Susceptibility-sensitive sequences show no chronic microhemorrhage or superficial siderosis. VASCULAR: Major intracranial arterial and venous sinus flow voids are preserved. SKULL AND UPPER CERVICAL SPINE: The visualized skull base, calvarium, upper cervical spine and extracranial soft tissues are normal. SINUSES/ORBITS: No fluid levels or advanced mucosal thickening. No mastoid or middle ear effusion. There are bilateral lens replacements. IMPRESSION: 1. 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 No associated hemorrhage or mass effect. 2. Mild atrophy and chronic microvascular ischemia. Electronically Signed   By: Ulyses Jarred M.D.   On: 08/21/2017 19:24    ____________________________________________   PROCEDURES  Procedure(s) performed: None  Procedures  Critical Care performed: No  ____________________________________________   INITIAL IMPRESSION / ASSESSMENT AND PLAN / ED COURSE  Pertinent labs & imaging results that were available during my care of the patient were reviewed by me and considered in my medical decision making (see chart for details).      Patient with vertiginous-like symptoms with no obvious focal deficits.  Given the patient's age though and fairly on remaining symptoms proceed with MRI of the brain to exclude central process such as a small ischemic stroke.  No cardiac symptoms, lab work reassuring.   Awake alert and oriented no distress.  ----------------------------------------- 8:13 PM on 08/21/2017 -----------------------------------------  Patient resting, agreeable with plan for admission.  Still has ongoing nausea.  Given her prolonged QT very hesitant to give prolonging medications and will give a small dose of Ativan here.  Patient agreeable and understanding with plan for admission to the hospital for work-up of a very small stroke.  Discussed with neurology Dr. Lucky Rathke (spelling) who is in agreement with treatment with aspirin admission for further evaluation and neurology consult.  VAN negative.  ____________________________________________   FINAL CLINICAL IMPRESSION(S) / ED DIAGNOSES  Final diagnoses:  Acute ischemic stroke ([place])      NEW MEDICATIONS STARTED DURING THIS VISIT:  New Prescriptions   No medications on file     Note:  This document was prepared using Dragon voice recognition software and may include unintentional dictation errors.     Delman Kitten, MD 08/21/17 Thom Chimes    Delman Kitten, MD 08/21/17 2015

## 2017-08-21 NOTE — Progress Notes (Signed)
PHARMACIST - PHYSICIAN COMMUNICATION  CONCERNING:  Enoxaparin (Lovenox) for DVT Prophylaxis    RECOMMENDATION: Patient was prescribed enoxaprin 40mg  q24 hours for VTE prophylaxis.   Filed Weights   08/21/17 1618  Weight: 237 lb (107.5 kg)    Body mass index is 40.68 kg/m.  Estimated Creatinine Clearance: 72.7 mL/min (by C-G formula based on SCr of 0.5 mg/dL).  Based on [place] patient is candidate for enoxaparin 40mg  every 12 hour dosing due to BMI being >40.  DESCRIPTION: Pharmacy has adjusted enoxaparin dose per [place], approved through [place] committee.  Patient is now receiving enoxaparin 40mg  every 12 hours.   Pernell Dupre, PharmD, BCPS Clinical Pharmacist 08/21/2017 9:57 PM

## 2017-08-21 NOTE — ED Triage Notes (Signed)
Pt to ED via EMS from home c/o n/v/d x3 hours and feeling weak.  10 episodes of vomiting and 1 episodes diarrhea.  States dx with vertigo 2 weeks ago but not given medication to go home with.  Denies syncope, pain or SOB.  Given 300cc normal saline bolus and 4mg  zofran via EMS en route.  CBG was 286.

## 2017-08-21 NOTE — H&P (Signed)
[facility] NAME: Haley Mooney    MR#:  [id number]  DATE OF BIRTH:  04/24/1940  DATE OF ADMISSION:  08/21/2017  PRIMARY CARE PHYSICIAN: Esmond Harps, MD   REQUESTING/REFERRING PHYSICIAN:   CHIEF COMPLAINT:   Chief Complaint  Patient presents with  . Nausea  . Emesis  . Weakness    HISTORY OF PRESENT ILLNESS: Haley Mooney  is a 77 y.o. female with a known history of GERD, hypertension, diabetes mellitus type 2, skin cancer, coronary artery disease presented to the emergency room for nausea vomiting and dizziness.  Patient has dizziness going on for the last 2 weeks and she went in and got evaluated at [facility].  She was discharged.  Patient still continued to have some dizziness and vomitings.  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  No complains of any slurred speech.  No weakness in any part of the body.  No tingling or numbness in any part of the body.  Hospitalist service was consulted for further care.  PAST MEDICAL HISTORY:   Past Medical History:  Diagnosis Date  . Anxiety   . Arthritis   . Bipolar disorder ([place])   . CAD (coronary artery disease)   . Cancer ([place])    SKIN  . Depression   . Diabetes mellitus without complication ([place])   . GERD (gastroesophageal reflux disease)    H/O  . Heart murmur   . Hypertension   . Neuromuscular disorder ([place])    NEUROPATHY  . Pain    CHEST OCC WITH EXERTION  . Pneumonia    IN PAST  . Shortness of breath dyspnea     PAST SURGICAL HISTORY:  Past Surgical History:  Procedure Laterality Date  . ABDOMINAL HYSTERECTOMY    . APPENDECTOMY    . BACK SURGERY    . BLADDER SUSPENSION    . CATARACT EXTRACTION Left   . CATARACT EXTRACTION W/PHACO Left 08/08/2015   Procedure: CATARACT EXTRACTION PHACO AND INTRAOCULAR LENS PLACEMENT (IOC);  Surgeon: Birder Robson, MD;  Location: [facility] ORS;  Service:  Ophthalmology;  Laterality: Left;  [place] 00:55 AP% 17.1 CDE 9.48 fluid pack lot # [id number]  . CATARACT EXTRACTION W/PHACO Right 09/05/2015   Procedure: CATARACT EXTRACTION PHACO AND INTRAOCULAR LENS PLACEMENT (IOC);  Surgeon: Birder Robson, MD;  Location: [facility] ORS;  Service: Ophthalmology;  Laterality: Right;  Lot# [id number] H US:00:43.0 AP%: 20.3 CDE: 8.74  . COLONOSCOPY    . CORONARY ANGIOPLASTY WITH STENT PLACEMENT     CATH DONE [facility] 6/17 DR PAULA MILLER  . EYE SURGERY    . JOINT REPLACEMENT    . TONSILLECTOMY      SOCIAL HISTORY:  Social History   Tobacco Use  . Smoking status: Former Smoker    Types: E-cigarettes  . Smokeless tobacco: Current User  Substance Use Topics  . Alcohol use: Yes    Comment: OCCAS    FAMILY HISTORY:  Family History  Problem Relation Age of Onset  . Heart failure Father     DRUG ALLERGIES:  Allergies  Allergen Reactions  . Ace Inhibitors Cough    Other reaction(s): OTHER  . Tetracyclines & Related Rash    REVIEW OF SYSTEMS:   CONSTITUTIONAL: No fever,has  fatigue and weakness.  EYES: No blurred or double vision.  EARS, NOSE, AND THROAT: No tinnitus or ear pain.  RESPIRATORY:  No cough, shortness of breath, wheezing or hemoptysis.  CARDIOVASCULAR: No chest pain, orthopnea, edema.  GASTROINTESTINAL: No nausea, vomiting, diarrhea or abdominal pain.  GENITOURINARY: No dysuria, hematuria.  ENDOCRINE: No polyuria, nocturia,  HEMATOLOGY: No anemia, easy bruising or bleeding SKIN: No rash or lesion. MUSCULOSKELETAL: No joint pain or arthritis.   NEUROLOGIC: No tingling, numbness, no weakness. Has dizziness  PSYCHIATRY: No anxiety or depression.   MEDICATIONS AT HOME:  Prior to Admission medications   Medication Sig Start Date End Date Taking? Authorizing Provider  ARIPiprazole (ABILIFY) 5 MG tablet Take 2.5 mg by mouth daily.    Yes [provider]  aspirin 81 MG tablet Take 81 mg by mouth daily.   Yes [provider]   atorvastatin (LIPITOR) 80 MG tablet Take 80 mg by mouth daily at 6 PM.   Yes [provider]  clonazePAM (KLONOPIN) 0.5 MG tablet Take 0.5 mg by mouth 3 (three) times daily as needed for anxiety.   Yes [provider]  gabapentin (NEURONTIN) 300 MG capsule Take 1 capsule by mouth daily. 07/15/17  Yes [provider]  Garcinia Cambogia-Chromium 500-200 MG-MCG TABS Take 1 tablet by mouth 2 (two) times daily.   Yes [provider]  glipiZIDE (GLUCOTROL) 10 MG tablet Take 10 mg by mouth 2 (two) times daily before a meal.   Yes [provider]  hydrochlorothiazide (HYDRODIURIL) 25 MG tablet Take 25 mg by mouth daily.   Yes [provider]  insulin detemir (LEVEMIR) 100 UNIT/ML injection Inject 46 Units into the skin at bedtime.    Yes [provider]  Melatonin 10 MG CAPS Take 10 mg by mouth at bedtime.   Yes [provider]  metFORMIN (GLUMETZA) 1000 MG (MOD) 24 hr tablet Take 1,000 mg by mouth 2 (two) times daily with a meal.    Yes [provider]  metoprolol succinate (TOPROL-XL) 25 MG 24 hr tablet Take 25 mg by mouth daily. Take with or immediately following a meal.    Yes [provider]  nitroGLYCERIN (NITROSTAT) 0.4 MG SL tablet Place 0.4 mg under the tongue every 5 (five) minutes as needed for chest pain.   Yes [provider]  QUEtiapine (SEROQUEL) 25 MG tablet Take 25 mg by mouth at bedtime.    Yes [provider]  VICTOZA 18 MG/3ML SOPN Inject 0.6 mg into the skin daily. 08/11/17  Yes [provider]  albuterol (PROVENTIL HFA;VENTOLIN HFA) 108 (90 Base) MCG/ACT inhaler Inhale 2 puffs into the lungs every 6 (six) hours as needed for wheezing or shortness of breath.    [provider]  amoxicillin-clavulanate (AUGMENTIN) 875-125 MG tablet Take 1 tablet by mouth every 12 (twelve) hours. Patient not taking: Reported on 08/21/2017 05/26/16   Coral Spikes, DO   nystatin-triamcinolone [facility] II) cream Apply to affected area daily Patient not taking: Reported on 08/21/2017 12/23/15   Marney Setting, NP  Olopatadine HCl 0.2 % SOLN 1 drop to affected eye daily. Patient not taking: Reported on 08/21/2017 05/26/16   Coral Spikes, DO  predniSONE (DELTASONE) 50 MG tablet 1 tablet daily x 5 days Patient not taking: Reported on 08/21/2017 05/26/16   Coral Spikes, DO      PHYSICAL EXAMINATION:   VITAL SIGNS: Blood pressure (!) 161/86, pulse 81, temperature (!) 97.5 F (36.4 C), resp. rate 14, height 5\' 4"  (1.626 m), weight 107.5 kg, SpO2 94 %.  GENERAL:  77 y.o.-year-old patient lying in the bed  with no acute distress.  EYES: Pupils equal, round, reactive to light and accommodation. No scleral icterus. Extraocular muscles intact.  HEENT: Head atraumatic, normocephalic. Oropharynx and nasopharynx clear.  NECK:  Supple, no jugular venous distention. No thyroid enlargement, no tenderness.  LUNGS: Normal breath sounds bilaterally, no wheezing, rales,rhonchi or crepitation. No use of accessory muscles of respiration.  CARDIOVASCULAR: S1, S2 normal. No murmurs, rubs, or gallops.  ABDOMEN: Soft, nontender, nondistended. Bowel sounds present. No organomegaly or mass.  EXTREMITIES: No pedal edema, cyanosis, or clubbing.  NEUROLOGIC: Cranial nerves II through XII are intact. Muscle strength 5/5 in all extremities. Sensation intact. Gait not checked.  PSYCHIATRIC: The patient is alert and oriented x 3.  SKIN: No obvious rash, lesion, or ulcer.   LABORATORY PANEL:   CBC Recent Labs  Lab 08/21/17 1628  WBC 13.6*  HGB 12.5  HCT 37.6  PLT 219  MCV 83.2  MCH 27.6  MCHC 33.1  RDW 14.5   ------------------------------------------------------------------------------------------------------------------  Chemistries  Recent Labs  Lab 08/21/17 1628  NA 138  K 3.7  CL 106  CO2 22  GLUCOSE 305*  BUN 21  CREATININE 0.50  CALCIUM 8.8*  AST 29  ALT  27  ALKPHOS 102  BILITOT 0.5   ------------------------------------------------------------------------------------------------------------------ estimated creatinine clearance is 72.7 mL/min (by C-G formula based on SCr of 0.5 mg/dL). ------------------------------------------------------------------------------------------------------------------ No results for input(s): TSH, T4TOTAL, T3FREE, THYROIDAB in the last 72 hours.  Invalid input(s): FREET3   Coagulation profile No results for input(s): INR, PROTIME in the last 168 hours. ------------------------------------------------------------------------------------------------------------------- No results for input(s): DDIMER in the last 72 hours. -------------------------------------------------------------------------------------------------------------------  Cardiac Enzymes Recent Labs  Lab 08/21/17 1628  TROPONINI <0.03   ------------------------------------------------------------------------------------------------------------------ Invalid input(s): POCBNP  ---------------------------------------------------------------------------------------------------------------  Urinalysis    Component Value Date/Time   COLORURINE YELLOW (A) 08/21/2017 1828   APPEARANCEUR CLEAR (A) 08/21/2017 1828   APPEARANCEUR CLOUDY 01/20/2014 1003   LABSPEC 1.024 08/21/2017 1828   LABSPEC 1.020 01/20/2014 1003   PHURINE 5.0 08/21/2017 1828   GLUCOSEU >=500 (A) 08/21/2017 1828   GLUCOSEU 500 mg/dL 01/20/2014 1003   HGBUR NEGATIVE 08/21/2017 1828   BILIRUBINUR NEGATIVE 08/21/2017 1828   BILIRUBINUR NEGATIVE 01/20/2014 1003   KETONESUR 20 (A) 08/21/2017 1828   PROTEINUR NEGATIVE 08/21/2017 1828   NITRITE NEGATIVE 08/21/2017 1828   LEUKOCYTESUR NEGATIVE 08/21/2017 1828   LEUKOCYTESUR TRACE 01/20/2014 1003     RADIOLOGY: Dg Chest 2 View  Result Date: 08/21/2017 CLINICAL DATA:  Negative, vomiting and diarrhea. EXAM: CHEST - 2 VIEW  COMPARISON:  05/26/2016 FINDINGS: Cardiomediastinal silhouette is normal. Mediastinal contours appear intact. Elevation of the right hemidiaphragm. There is no evidence of focal airspace consolidation, pleural effusion or pneumothorax. Osseous structures are without acute abnormality. Soft tissues are grossly normal. IMPRESSION: No active cardiopulmonary disease. Elevation of the right hemidiaphragm with uncertain significance. Electronically Signed   By: Fidela Salisbury M.D.   On: 08/21/2017 17:14   Mr Brain Wo Contrast  Result Date: 08/21/2017 CLINICAL DATA:  Episodic vertigo EXAM: MRI HEAD WITHOUT CONTRAST TECHNIQUE: Multiplanar, multiecho pulse sequences of the brain and surrounding structures were obtained without intravenous contrast. COMPARISON:  None. 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 No associated hemorrhage or mass effect. The midline structures are normal. There are no old infarcts. Minimal white matter hyperintensity, nonspecific and commonly seen in asymptomatic patients of this age. Generalized atrophy without lobar predilection. Susceptibility-sensitive sequences show no chronic microhemorrhage or superficial  siderosis. VASCULAR: Major intracranial arterial and venous sinus flow voids are preserved. SKULL AND UPPER CERVICAL SPINE: The visualized skull base, calvarium, upper cervical spine and extracranial soft tissues are normal. SINUSES/ORBITS: No fluid levels or advanced mucosal thickening. No mastoid or middle ear effusion. There are bilateral lens replacements. IMPRESSION: 1. 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 No associated hemorrhage or mass effect. 2. Mild atrophy and chronic microvascular ischemia. Electronically Signed   By: Ulyses Jarred M.D.   On: 08/21/2017 19:24    EKG: Orders placed or performed during the hospital encounter of 08/21/17  . ED EKG  . ED EKG  . EKG 12-Lead  . EKG  12-Lead    IMPRESSION AND PLAN:  77 year old female patient with history of coronary disease, hypertension, diabetes mellitus type 2, GERD presented to the emergency room with nausea vomiting and dizziness  -Subacute ischemic CVA Patient out of TPA window Been going on for more than a week Aspirin 325 mg daily Neurology consult Check carotid ultrasound and echocardiogram Swallow study  -Dehydration IV fluids  -Dizziness probably secondary to pontine CVA  -Type 2 diabetes mellitus Diet controlled with sliding scale coverage with insulin  -DVT prophylaxis subcu Lovenox daily  All the records are reviewed and case discussed with ED provider. Management plans discussed with the patient, family and they are in agreement.  CODE STATUS:DNR Advance Directive Documentation     Most Recent Value  Type of Advance Directive  Healthcare Power of Attorney, Living will, Out of facility DNR (pink MOST or yellow form)  Pre-existing out of facility DNR order (yellow form or pink MOST form)  -  "MOST" Form in Place?  -       TOTAL TIME TAKING CARE OF THIS PATIENT: 52 minutes.    Saundra Shelling M.D on 08/21/2017 at 9:14 PM  Between 7am to 6pm - Pager - [phone]  After 6pm go to [email or web address] - password [email or web address] [facility]  Office  [phone]  CC: Primary care physician; Esmond Harps, MD

## 2017-08-21 NOTE — ED Notes (Signed)
Patient transported to MRI 

## 2017-08-22 ENCOUNTER — Inpatient Hospital Stay (HOSPITAL_COMMUNITY)
Admit: 2017-08-22 | Discharge: 2017-08-22 | Disposition: A | Discharge status: Home / Self Care | Payer: Medicare Other | Attending: Internal Medicine | Admitting: Internal Medicine

## 2017-08-22 ENCOUNTER — Inpatient Hospital Stay: Payer: Medicare Other

## 2017-08-22 DIAGNOSIS — I639 Cerebral infarction, unspecified: Secondary | ICD-10-CM

## 2017-08-22 DIAGNOSIS — I5030 Unspecified diastolic (congestive) heart failure: Secondary | ICD-10-CM

## 2017-08-22 LAB — HEMOGLOBIN A1C
HEMOGLOBIN A1C: 8 % — AB (ref 4.8–5.6)
MEAN PLASMA GLUCOSE: 182.9 mg/dL

## 2017-08-22 LAB — GLUCOSE, CAPILLARY
GLUCOSE-CAPILLARY: 175 mg/dL — AB (ref 70–99)
Glucose-Capillary: 172 mg/dL — ABNORMAL HIGH (ref 70–99)
Glucose-Capillary: 131 mg/dL — ABNORMAL HIGH (ref 70–99)
Glucose-Capillary: 200 mg/dL — ABNORMAL HIGH (ref 70–99)

## 2017-08-22 LAB — ECHOCARDIOGRAM COMPLETE
HEIGHTINCHES: 64 in
Weight: 3792 oz

## 2017-08-22 LAB — LIPID PANEL
CHOLESTEROL: 156 mg/dL (ref 0–200)
HDL: 53 mg/dL (ref >40)
LDL Cholesterol: 81 mg/dL (ref 0–99)
TRIGLYCERIDES: 112 mg/dL (ref <150)
Total CHOL/HDL Ratio: 2.9 RATIO
VLDL: 22 mg/dL (ref 0–40)

## 2017-08-22 MED ORDER — INSULIN DETEMIR 100 UNIT/ML ~~LOC~~ SOLN
35.0000 [IU] | Freq: Every day | SUBCUTANEOUS | Status: DC
  Filled 2017-08-22: qty 0.35

## 2017-08-22 MED ORDER — PERFLUTREN LIPID MICROSPHERE
1.0000 mL | INTRAVENOUS | Status: AC | PRN
  Administered 2017-08-22: 2 mL via INTRAVENOUS
  Filled 2017-08-22: qty 10

## 2017-08-22 MED ORDER — SODIUM CHLORIDE 0.9 % IV SOLN
INTRAVENOUS | Status: DC
  Administered 2017-08-23: 02:00:00 via INTRAVENOUS

## 2017-08-22 MED ORDER — INSULIN DETEMIR 100 UNIT/ML ~~LOC~~ SOLN
35.0000 [IU] | Freq: Every day | SUBCUTANEOUS | Status: DC
  Administered 2017-08-22 – 2017-08-24 (×3): 35 [IU] via SUBCUTANEOUS
  Filled 2017-08-22 (×4): qty 0.35

## 2017-08-22 MED ORDER — IBUPROFEN 400 MG PO TABS
400.0000 mg | ORAL_TABLET | Freq: Three times a day (TID) | ORAL | Status: DC | PRN
  Administered 2017-08-22: 400 mg via ORAL
  Filled 2017-08-22 (×2): qty 1

## 2017-08-22 NOTE — Plan of Care (Signed)
  Problem: Education: Goal: Knowledge of disease or condition will improve Outcome: Progressing Goal: Knowledge of secondary prevention will improve Outcome: Progressing Goal: Knowledge of patient specific risk factors addressed and post discharge goals established will improve Outcome: Progressing   Problem: Coping: Goal: Will verbalize positive feelings about self Outcome: Progressing   Problem: Self-Care: Goal: Ability to participate in self-care as condition permits will improve Outcome: Progressing Goal: Verbalization of feelings and concerns over difficulty with self-care will improve Outcome: Progressing Goal: Ability to communicate needs accurately will improve Outcome: Progressing   Problem: Nutrition: Goal: Risk of aspiration will decrease Outcome: Progressing   Problem: Ischemic Stroke/TIA Tissue Perfusion: Goal: Complications of ischemic stroke/TIA will be minimized Outcome: Progressing   Problem: Education: Goal: Knowledge of General Education information will improve Description Including pain rating scale, medication(s)/side effects and non-pharmacologic comfort measures Outcome: Progressing   Problem: Clinical Measurements: Goal: Ability to maintain clinical measurements within normal limits will improve Outcome: Progressing Goal: Will remain free from infection Outcome: Progressing Goal: Diagnostic test results will improve Outcome: Progressing Goal: Respiratory complications will improve Outcome: Progressing Goal: Cardiovascular complication will be avoided Outcome: Progressing   Problem: Activity: Goal: Risk for activity intolerance will decrease Outcome: Progressing   Problem: Elimination: Goal: Will not experience complications related to bowel motility Outcome: Progressing   Problem: Pain Managment: Goal: General experience of comfort will improve Outcome: Progressing   Problem: Safety: Goal: Ability to remain free from injury will  improve Outcome: Progressing   Problem: Skin Integrity: Goal: Risk for impaired skin integrity will decrease Outcome: Progressing

## 2017-08-22 NOTE — Clinical Social Work Note (Signed)
Clinical Social Work Assessment  Patient Details  Name: Haley Mooney MRN: [id number] Date of Birth: 05/15/1940  Date of referral:  08/22/17               Reason for consult:  Facility Placement                Permission sought to share information with:  Chartered certified accountant granted to share information::  Yes, Verbal Permission Granted  Name::      [place]::   Haley Mooney   Relationship::     Contact Information:     Housing/Transportation Living arrangements for the past 2 months:  [place] of Information:  Patient, Adult Children Patient Interpreter Needed:  None Criminal Activity/Legal Involvement Pertinent to Current Situation/Hospitalization:  No - Comment as needed Significant Relationships:  Adult Children Lives with:  Self Do you feel safe going back to the place where you live?  Yes Need for family participation in patient care:  Yes (Comment)  Care giving concerns:  Patient lives alone in [place].    Social Worker assessment / plan:  Holiday representative (CSW) reviewed chart and noted PT is recommending SNF. CSW met with patient and her daughter/ HPOA Haley Mooney was at bedside. Haley Mooney provided her contact numbers: home [phone], work [phone] and cell [phone] and her husband Haley Mooney's cell [phone]. Per patient she lives alone in [place] and has 3 adult children. Per patient Haley Mooney is her oldest child and has HPOA. CSW explained that PT is recommending SNF and that medicare requires a 3 night qualifying inpatient stay in a hospital in order to pay for SNF. Patient was admitted to inpatient on 08/21/17. Patient and daughter verbalized their understanding and are agreeable to SNF search in [place]. FL2 complete and faxed out. Per patient she has never been to rehab before and would like to go to [facility]. CSW asked [facility] to review referral. CSW will continue to follow and assist as  needed.   Employment status:  Disabled (Comment on whether or not currently receiving Disability), Retired Forensic scientist:  Medicare PT Recommendations:  [place] / Referral to community resources:  [place]  Patient/Family's Response to care:  Patient is agreeable to [facility] in [place].   Patient/Family's Understanding of and Emotional Response to Diagnosis, Current Treatment, and Prognosis:  Patient was very pleasant and thanked CSW for assistance.   Emotional Assessment Appearance:  Appears stated age Attitude/Demeanor/Rapport:    Affect (typically observed):  Accepting, Adaptable, Pleasant Orientation:  Oriented to Self, Oriented to Place, Oriented to  Time, Oriented to Situation Alcohol / Substance use:  Not Applicable Psych involvement (Current and /or in the community):  No (Comment)  Discharge Needs  Concerns to be addressed:  Discharge Planning Concerns Readmission within the last 30 days:  No Current discharge risk:  Dependent with Mobility Barriers to Discharge:  Continued Medical Work up   [employer], Haley Beets, LCSW 08/22/2017, 3:19 PM

## 2017-08-22 NOTE — Progress Notes (Signed)
*  PRELIMINARY RESULTS* Echocardiogram 2D Echocardiogram has been performed.  Haley Mooney 08/22/2017, 10:51 AM

## 2017-08-22 NOTE — Progress Notes (Signed)
[facility] [place] NAME: Haley Mooney    MR#:  [id number]  DATE OF BIRTH:  05-Jul-1940  SUBJECTIVE:  CHIEF COMPLAINT:   Chief Complaint  Patient presents with  . Nausea  . Emesis  . Weakness   Continues to have dizziness and weakness  REVIEW OF SYSTEMS:    Review of Systems  Constitutional: Positive for malaise/fatigue. Negative for chills and fever.  HENT: Negative for sore throat.   Eyes: Negative for blurred vision, double vision and pain.  Respiratory: Negative for cough, hemoptysis, shortness of breath and wheezing.   Cardiovascular: Negative for chest pain, palpitations, orthopnea and leg swelling.  Gastrointestinal: Negative for abdominal pain, constipation, diarrhea, heartburn, nausea and vomiting.  Genitourinary: Negative for dysuria and hematuria.  Musculoskeletal: Negative for back pain and joint pain.  Skin: Negative for rash.  Neurological: Positive for dizziness. Negative for sensory change, speech change, focal weakness and headaches.  Endo/Heme/Allergies: Does not bruise/bleed easily.  Psychiatric/Behavioral: Negative for depression. The patient is not nervous/anxious.     DRUG ALLERGIES:   Allergies  Allergen Reactions  . Ace Inhibitors Cough    Other reaction(s): OTHER  . Tetracyclines & Related Rash    VITALS:  Blood pressure (!) 146/71, pulse 82, temperature 97.9 F (36.6 C), temperature source Oral, resp. rate 16, height 5\' 4"  (1.626 m), weight 107.5 kg, SpO2 99 %.  PHYSICAL EXAMINATION:   Physical Exam  GENERAL:  77 y.o.-year-old patient lying in the bed with no acute distress.  EYES: Pupils equal, round, reactive to light and accommodation. No scleral icterus. Extraocular muscles intact.  HEENT: Head atraumatic, normocephalic. Oropharynx and nasopharynx clear.  NECK:  Supple, no jugular venous distention. No thyroid enlargement, no tenderness.  LUNGS: Normal breath sounds bilaterally, no wheezing,  rales, rhonchi. No use of accessory muscles of respiration.  CARDIOVASCULAR: S1, S2 normal. No murmurs, rubs, or gallops.  ABDOMEN: Soft, nontender, nondistended. Bowel sounds present. No organomegaly or mass.  EXTREMITIES: No cyanosis, clubbing or edema b/l.    NEUROLOGIC: Cranial nerves II through XII are intact. No focal Motor or sensory deficits b/l.   PSYCHIATRIC: The patient is alert and oriented x 3.  SKIN: No obvious rash, lesion, or ulcer.   LABORATORY PANEL:   CBC Recent Labs  Lab 08/21/17 1628  WBC 13.6*  HGB 12.5  HCT 37.6  PLT 219   ------------------------------------------------------------------------------------------------------------------ Chemistries  Recent Labs  Lab 08/21/17 1628  NA 138  K 3.7  CL 106  CO2 22  GLUCOSE 305*  BUN 21  CREATININE 0.50  CALCIUM 8.8*  AST 29  ALT 27  ALKPHOS 102  BILITOT 0.5   ------------------------------------------------------------------------------------------------------------------  Cardiac Enzymes Recent Labs  Lab 08/21/17 1628  TROPONINI <0.03   ------------------------------------------------------------------------------------------------------------------  RADIOLOGY:  Dg Chest 2 View  Result Date: 08/21/2017 CLINICAL DATA:  Negative, vomiting and diarrhea. EXAM: CHEST - 2 VIEW COMPARISON:  05/26/2016 FINDINGS: Cardiomediastinal silhouette is normal. Mediastinal contours appear intact. Elevation of the right hemidiaphragm. There is no evidence of focal airspace consolidation, pleural effusion or pneumothorax. Osseous structures are without acute abnormality. Soft tissues are grossly normal. IMPRESSION: No active cardiopulmonary disease. Elevation of the right hemidiaphragm with uncertain significance. Electronically Signed   By: Fidela Salisbury M.D.   On: 08/21/2017 17:14   Mr Brain Wo Contrast  Result Date: 08/21/2017 CLINICAL DATA:  Episodic vertigo EXAM: MRI HEAD WITHOUT CONTRAST TECHNIQUE:  Multiplanar, multiecho pulse sequences of the brain and surrounding structures were obtained  without intravenous contrast. COMPARISON:  None. 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 No associated hemorrhage or mass effect. The midline structures are normal. There are no old infarcts. Minimal white matter hyperintensity, nonspecific and commonly seen in asymptomatic patients of this age. Generalized atrophy without lobar predilection. Susceptibility-sensitive sequences show no chronic microhemorrhage or superficial siderosis. VASCULAR: Major intracranial arterial and venous sinus flow voids are preserved. SKULL AND UPPER CERVICAL SPINE: The visualized skull base, calvarium, upper cervical spine and extracranial soft tissues are normal. SINUSES/ORBITS: No fluid levels or advanced mucosal thickening. No mastoid or middle ear effusion. There are bilateral lens replacements. IMPRESSION: 1. 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 No associated hemorrhage or mass effect. 2. Mild atrophy and chronic microvascular ischemia. Electronically Signed   By: Ulyses Jarred M.D.   On: 08/21/2017 19:24   US Carotid Bilateral (at [facility] And [facility] Only)  Result Date: 08/22/2017 CLINICAL DATA:  77 year old female with history of cerebrovascular accident. Cardiovascular risk factors include hypertension, stroke/TIA, hyperlipidemia, diabetes, tobacco use EXAM: BILATERAL CAROTID DUPLEX ULTRASOUND TECHNIQUE: Pearline Cables scale imaging, color Doppler and duplex ultrasound were performed of bilateral carotid and vertebral arteries in the neck. COMPARISON:  None. FINDINGS: Criteria: Quantification of carotid stenosis is based on velocity parameters that correlate the residual internal carotid diameter with NASCET-based stenosis levels, using the diameter of the distal internal carotid lumen as the denominator for stenosis measurement. The following velocity  measurements were obtained: RIGHT ICA:  Systolic 323 cm/sec, Diastolic 34 cm/sec CCA:  84 cm/sec SYSTOLIC ICA/CCA RATIO:  1.5 ECA:  102 cm/sec LEFT ICA:  Systolic 557 cm/sec, Diastolic 28 cm/sec CCA:  98 cm/sec SYSTOLIC ICA/CCA RATIO:  1.1 ECA:  97 cm/sec Right Brachial SBP: Not acquired Left Brachial SBP: Not acquired RIGHT CAROTID ARTERY: No significant calcifications of the right common carotid artery. Intermediate waveform maintained. Heterogeneous and partially calcified plaque at the right carotid bifurcation. No significant lumen shadowing. Low resistance waveform of the right ICA. No significant tortuosity. RIGHT VERTEBRAL ARTERY: Antegrade flow with low resistance waveform. LEFT CAROTID ARTERY: No significant calcifications of the left common carotid artery. Intermediate waveform maintained. Heterogeneous and partially calcified plaque at the left carotid bifurcation without significant lumen shadowing. Low resistance waveform of the left ICA. No significant tortuosity. LEFT VERTEBRAL ARTERY:  Antegrade flow with low resistance waveform. IMPRESSION: Color duplex indicates minimal heterogeneous and calcified plaque, with no hemodynamically significant stenosis by duplex criteria in the extracranial cerebrovascular circulation. Signed, Dulcy Fanny. Dellia Nims, RPVI [facility] Electronically Signed   By: Corrie Mckusick D.O.   On: 08/22/2017 09:53   Mr Jodene Nam Head/brain DU Cm  Result Date: 08/22/2017 CLINICAL DATA:  Initial evaluation for acute stroke. EXAM: MRA HEAD WITHOUT CONTRAST TECHNIQUE: Angiographic images of the Circle of Willis were obtained using MRA technique without intravenous contrast. COMPARISON:  Prior MRI from 08/21/2017. FINDINGS: ANTERIOR CIRCULATION: Distal cervical segments of the internal carotid arteries are widely patent with antegrade flow. Milder atheromatous irregularity within the petrous, cavernous, and supraclinoid ICAs  without hemodynamically significant stenosis. Origin of the ophthalmic arteries patent. ICA termini widely patent. Right A1 widely patent. Left A1 hypoplastic and/or absent, accounting for the diminutive left ICA as compared to the right. Normal anterior communicating artery. Anterior cerebral arteries patent to their distal aspects without stenosis. M1 segments patent without stenosis. Normal MCA bifurcations. No proximal M2 occlusion. Visualized distal MCA branches well  perfused and symmetric. Small vessel atheromatous irregularity noted. POSTERIOR CIRCULATION: Dominant left vertebral artery mildly tortuous crossing the midline towards the vertebrobasilar junction. Vertebral arteries widely patent bilaterally without stenosis. Left PICA patent. Right PICA not visualized. Dominant right AICA. Basilar tortuous but widely patent to its distal aspect without stenosis. Superior cerebral arteries patent bilaterally. Both of the posterior cerebral artery supplied via the basilar. Scattered atheromatous irregularity within the PCAs bilaterally without hemodynamically significant stenosis. No intracranial aneurysm. IMPRESSION: 1. Negative intracranial MRA with no large vessel occlusion identified. No hemodynamically significant or correctable stenosis. 2. Distal small vessel atheromatous irregularity. 3. Hypoplastic/absent right A1, with the anterior cerebral artery supplied via the left carotid artery system. Electronically Signed   By: Jeannine Boga M.D.   On: 08/22/2017 01:47     ASSESSMENT AND PLAN:   * 77 year old female patient with history of coronary disease, hypertension, diabetes mellitus type 2, GERD presented to the emergency room with nausea vomiting and dizziness  - Subacute ischemic CVA, left pons Aspirin 325 mg daily Neurology consulted. 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  CODE STATUS: DNR  DVT Prophylaxis: SCDs  TOTAL TIME TAKING CARE OF THIS PATIENT: 35 minutes.   POSSIBLE D/C IN 1-2 DAYS, DEPENDING ON CLINICAL CONDITION.  Neita Carp M.D on 08/22/2017 at 2:26 PM  Between 7am to 6pm - Pager - [phone]  After 6pm go to [email or web address] - password [email or web address] [facility]  Office  [phone]  CC: Primary care physician; Esmond Harps, MD  Note: This dictation was prepared with Dragon dictation along with smaller phrase technology. Any transcriptional errors that result from this process are unintentional.

## 2017-08-22 NOTE — Clinical Social Work Placement (Addendum)
   CLINICAL SOCIAL WORK PLACEMENT  NOTE  Date:  08/22/2017  Patient Details  Name: [name] MRN: [id number] Date of Birth: 1940/02/05  Clinical Social Work is seeking post-discharge placement for this patient at the [place] level of care (*CSW will initial, date and re-position this form in  chart as items are completed):  Yes   Patient/family provided with [facility] Work Department's list of facilities offering this level of care within the geographic area requested by the patient (or if unable, by the patient's family).  Yes   Patient/family informed of their freedom to choose among providers that offer the needed level of care, that participate in Medicare, Medicaid or managed care program needed by the patient, have an available bed and are willing to accept the patient.  Yes   Patient/family informed of [facility]'s ownership interest in [facility] and [facility], as well as of the fact that they are under no obligation to receive care at these facilities.  PASRR submitted to EDS on 08/22/17     PASRR number received on   08/22/17  Existing PASRR number confirmed on       FL2 transmitted to all facilities in geographic area requested by pt/family on 08/22/17     FL2 transmitted to all facilities within larger geographic area on       Patient informed that his/her managed care company has contracts with or will negotiate with certain facilities, including the following:            Patient/family informed of bed offers received.  Patient chooses bed at       Physician recommends and patient chooses bed at      Patient to be transferred to   on  .  Patient to be transferred to facility by       Patient family notified on   of transfer.  Name of family member notified:        PHYSICIAN       Additional Comment:    _______________________________________________ Catha Ontko, Veronia Beets, LCSW 08/22/2017, 3:17 PM

## 2017-08-22 NOTE — Care Management (Signed)
Patient presents from home with nausea, vomiting and weakness and found to have had CVA. Has been evaluated by OT and PT and recommending skilled nursing .  CSW is aware.

## 2017-08-22 NOTE — NC FL2 (Signed)
[place] LEVEL OF CARE SCREENING TOOL     IDENTIFICATION  Patient Name: Haley Mooney Birthdate: November 27, 1940 Sex: female Admission Date (Current Location): 08/21/2017  [facility] and [place] Number:  Engineering geologist and Address:  [facility], [address] [place], [place] [address]      Provider Number: [id number]  Attending Physician Name and Address:  Hillary Bow, MD  Relative Name and Phone Number:       Current Level of Care: Hospital Recommended Level of Care: [place] Prior Approval Number:    Date Approved/Denied:   PASRR Number:    Discharge Plan: SNF    Current Diagnoses: Patient Active Problem List   Diagnosis Date Noted  . CVA (cerebral vascular accident) ([place]) 08/21/2017    Orientation RESPIRATION BLADDER Height & Weight     Self, Time, Situation, Place  Normal Continent Weight: 237 lb (107.5 kg) Height:  5\' 4"  (162.6 cm)  BEHAVIORAL SYMPTOMS/MOOD NEUROLOGICAL BOWEL NUTRITION STATUS      Continent Diet(Diet: Dysphagia 3 (Mech soft);Thin liquid )  AMBULATORY STATUS COMMUNICATION OF NEEDS Skin   Extensive Assist Verbally Normal                       Personal Care Assistance Level of Assistance  Bathing, Feeding, Dressing Bathing Assistance: Limited assistance Feeding assistance: Independent Dressing Assistance: Limited assistance     Functional Limitations Info  Sight, Hearing, Speech Sight Info: Adequate Hearing Info: Adequate Speech Info: Adequate    SPECIAL CARE FACTORS FREQUENCY  PT (By licensed PT), OT (By licensed OT)     PT Frequency: (5) OT Frequency: (5)            Contractures      Additional Factors Info  Code Status, Allergies Code Status Info: (DNR ) Allergies Info: (Ace Inhibitors, Tetracyclines & Related)           Current Medications (08/22/2017):  This is the current hospital active medication list Current Facility-Administered Medications   Medication Dose Route Frequency Provider Last Rate Last Dose  . acetaminophen (TYLENOL) tablet 650 mg  650 mg Oral Q4H PRN Saundra Shelling, MD   650 mg at 08/22/17 0556   Or  . acetaminophen (TYLENOL) solution 650 mg  650 mg Per Tube Q4H PRN Saundra Shelling, MD       Or  . acetaminophen (TYLENOL) suppository 650 mg  650 mg Rectal Q4H PRN Pyreddy, Pavan, MD      . albuterol (PROVENTIL) (2.5 MG/3ML) 0.083% nebulizer solution 3 mL  3 mL Inhalation Q6H PRN Pyreddy, Pavan, MD      . ARIPiprazole (ABILIFY) tablet 2.5 mg  2.5 mg Oral Daily Pyreddy, Pavan, MD      . aspirin suppository 300 mg  300 mg Rectal Daily Pyreddy, Pavan, MD       Or  . aspirin EC tablet 325 mg  325 mg Oral Daily Pyreddy, Pavan, MD   325 mg at 08/22/17 1000  . atorvastatin (LIPITOR) tablet 80 mg  80 mg Oral q1800 Pyreddy, Reatha Harps, MD      . clonazePAM (KLONOPIN) tablet 0.5 mg  0.5 mg Oral TID PRN Saundra Shelling, MD      . enoxaparin (LOVENOX) injection 40 mg  40 mg Subcutaneous Q12H Pyreddy, Reatha Harps, MD   40 mg at 08/22/17 1311  . gabapentin (NEURONTIN) capsule 300 mg  300 mg Oral Daily Pyreddy, Pavan, MD      . insulin aspart (  novoLOG) injection 0-5 Units  0-5 Units Subcutaneous QHS Pyreddy, Pavan, MD      . insulin aspart (novoLOG) injection 0-9 Units  0-9 Units Subcutaneous TID WC Saundra Shelling, MD   1 Units at 08/22/17 1310  . nitroGLYCERIN (NITROSTAT) SL tablet 0.4 mg  0.4 mg Sublingual Q5 min PRN Pyreddy, Reatha Harps, MD      . ondansetron (ZOFRAN) injection 4 mg  4 mg Intravenous Q6H PRN Pyreddy, Pavan, MD      . QUEtiapine (SEROQUEL) tablet 25 mg  25 mg Oral QHS Saundra Shelling, MD   25 mg at 08/21/17 2343     Discharge Medications: Please see discharge summary for a list of discharge medications.  Relevant Imaging Results:  Relevant Lab Results:   Additional Information (SSN: [id number])  Sari Cogan, Veronia Beets, LCSW

## 2017-08-22 NOTE — Evaluation (Signed)
Physical Therapy Evaluation Patient Details Name: Haley Mooney MRN: [id number] DOB: [birth year] Today's Date: 08/22/2017   History of Present Illness  Patient is a 77 year old female admitted for stroke workup following c/o dizziness and blurred vision.  PMH includes bipolar disorder, pneumonia, neuromuscular disorder, Htn, heart murmur, GERD, depression, CA, arthritis and depression.  Clinical Impression  Patient is a 77 year old female who lives in a one story home alone.  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  Pt in bed and reports mild dizziness upon PT arrival.  She requires minimal assistance for bed mobility but is able to sit at bedside with fair balance.  Pt reported increase in dizziness with prolonged upright sitting or standing throughout evaluation.  She also reported blurred vision which she stated to be new.  PT and NA provided assistance for safety as pt stood with RW.  She was able to rise on her own but reported that dizziness increased significantly.  She was able to stand for 3-4 min as pt's bedding was changed but she requested to return to bed immediately after it was finished. PT also provided assistance in donning new gown.  Pt presented with no asymmetry of strength in UE and LE and strength deficits of UE's when tested.  No sensation loss noted.  She presented with mild coordination deficits which may be attributed to vision loss more so than motor control.  PT reviewed supine bed exercises and educated concerning importance, frequency and what to expect from rehabilitation in the SNF setting.  Pt was already familiar with exercises related to prior TKA's and showed good understanding.  Pt will continue to benefit from skilled PT with focus on strength, tolerance to activity, safe functional transfer and balance and fall prevention.    Follow Up Recommendations SNF    Equipment Recommendations  None recommended by PT(TBD at next venue of  care.)    Recommendations for Other Services       Precautions / Restrictions Precautions Precautions: Fall Restrictions Weight Bearing Restrictions: No      Mobility  Bed Mobility Overal bed mobility: Needs Assistance Bed Mobility: Supine to Sit;Sit to Supine     Supine to sit: Min assist Sit to supine: Min assist   General bed mobility comments: Very minimal assist to bring LE's over EOB.  Transfers Overall transfer level: Needs assistance Equipment used: Rolling walker (2 wheeled) Transfers: Sit to/from Stand Sit to Stand: Min assist;+2 safety/equipment         General transfer comment: NA provided assistance for safety but pt was able to stand without assistance to rise.  Pt able to stand for 3-4 minutes while NA changed pt's bed sheets.  Pt reported increasing dizziness as standing time progressed.  Ambulation/Gait Ambulation/Gait assistance: (Deferred due to pt report of dizziness.)              Stairs            Wheelchair Mobility    Modified Rankin (Stroke Patients Only)       Balance Overall balance assessment: Needs assistance Sitting-balance support: Feet supported Sitting balance-Leahy Scale: Good     Standing balance support: Bilateral upper extremity supported Standing balance-Leahy Scale: Fair                               Pertinent Vitals/Pain Pain Assessment: Faces Faces Pain Scale: Hurts a little bit  Pain Location: R side which pt described as a "stitch".    Home Living Family/patient expects to be discharged to:: Skilled nursing facility                      Prior Function Level of Independence: Independent         Comments: Pt is a community ambulator but reports recent decline in function.  Has a RW from previous TKA's.     Hand Dominance        Extremity/Trunk Assessment   Upper Extremity Assessment Upper Extremity Assessment: Generalized weakness(Grossly 3+/5 bilaterally.  No  asymmetry noted.)    Lower Extremity Assessment Lower Extremity Assessment: Overall WFL for tasks assessed(Grossly 4/5 bilaterally.  No asymmetry noted.  No sensation loss reported.)    Cervical / Trunk Assessment Cervical / Trunk Assessment: Normal  Communication      Cognition Arousal/Alertness: Awake/alert Behavior During Therapy: WFL for tasks assessed/performed Overall Cognitive Status: Within Functional Limits for tasks assessed                                 General Comments: Follows commands consistently.      General Comments      Exercises Other Exercises Other Exercises: Reviewed all pertinent bed exercises which pt should do between therapy sessions and frequency of exercises. x5 min Other Exercises: Provided assistance with donning gown and changing pt's bed sheets as she was standing by bedside, CGA for safety.  x3 min   Assessment/Plan    PT Assessment Patient needs continued PT services  PT Problem List Decreased strength;Decreased mobility;Decreased balance;Decreased knowledge of use of DME;Decreased activity tolerance       PT Treatment Interventions      PT Goals (Current goals can be found in the Care Plan section)  Acute Rehab PT Goals Patient Stated Goal: To participate in therapy for return of strength and walk for exercise again. PT Goal Formulation: With patient Time For Goal Achievement: 09/05/17 Potential to Achieve Goals: Good    Frequency Min 2X/week   Barriers to discharge        Co-evaluation               AM-PAC PT "6 Clicks" Daily Activity  Outcome Measure Difficulty turning over in bed (including adjusting bedclothes, sheets and blankets)?: A Little Difficulty moving from lying on back to sitting on the side of the bed? : A Lot Difficulty sitting down on and standing up from a chair with arms (e.g., wheelchair, bedside commode, etc,.)?: A Lot Help needed moving to and from a bed to chair (including a  wheelchair)?: A Lot Help needed walking in hospital room?: A Lot Help needed climbing 3-5 steps with a railing? : A Lot 6 Click Score: 13    End of Session Equipment Utilized During Treatment: Gait belt Activity Tolerance: Treatment limited secondary to medical complications (Comment) Patient left: in bed;with family/visitor present;with call bell/phone within reach Nurse Communication: (Pt needs IV disconnected to don new gown.) PT Visit Diagnosis: Unsteadiness on feet (R26.81);Dizziness and giddiness (R42);Muscle weakness (generalized) (M62.81)    Time: 4097-3532 PT Time Calculation (min) (ACUTE ONLY): 41 min   Charges:   PT Evaluation $PT Eval Moderate Complexity: 1 Mod PT Treatments $Therapeutic Activity: 8-22 mins        Roxanne Gates, PT, DPT   Roxanne Gates 08/22/2017, 12:18 PM

## 2017-08-22 NOTE — Progress Notes (Signed)
PT Cancellation Note  Patient Details Name: Haley Mooney MRN: [id number] DOB: 1940/07/12   Cancelled Treatment:    Reason Eval/Treat Not Completed: Patient at procedure or test/unavailable.  Order received.  Chart reviewed.  Neurologist in room with pt upon PT arrival and then pt to receive echocardiogram.  Will re-attempt later as time allows.   Roxanne Gates, PT,DPT 08/22/2017, 9:59 AM

## 2017-08-22 NOTE — Evaluation (Signed)
Occupational Therapy Evaluation Patient Details Name: Haley Mooney MRN: [id number] DOB: May 04, 1940 Today's Date: 08/22/2017    History of Present Illness Patient is a 77 year old female admitted for stroke workup following c/o dizziness and blurred vision.  PMH includes bipolar disorder, pneumonia, neuromuscular disorder, Htn, heart murmur, GERD, depression, CA, arthritis and depression.   Clinical Impression   Pt seen for OT evaluation this date. Prior to hospital admission, pt was living by herself and independent with mobility and ADL. Currently pt demonstrates impairments in vision with intermittent blurriness that appears to worsen with mobility, intermittent dizziness that also worsens with mobility, and generalized weakness t/o. No sensory deficits appreciated. Pt currently requires min-mod assist for LB ADL tasks and Min A for toileting. Min-mod assist for transfer with cues for hand/foot placement to improve stability. Pt noting feeling SOB during mobility. VSS t/o. Pt educated in pursed lip breathing during mobility tasks to support breathing. Pt would benefit from skilled OT to address noted impairments and functional limitations (see below for any additional details) in order to maximize safety and independence while minimizing falls risk and caregiver burden.  Upon hospital discharge, recommend pt discharge to [facility]. Will continue to assess progress for appropriateness of therapy services in alternative venue.     Follow Up Recommendations  SNF    Equipment Recommendations  3 in 1 bedside commode    Recommendations for Other Services       Precautions / Restrictions Precautions Precautions: Fall Restrictions Weight Bearing Restrictions: No      Mobility Bed Mobility Overal bed mobility: Needs Assistance Bed Mobility: Supine to Sit;Sit to Supine     Supine to sit: Supervision;HOB elevated Sit to supine: Min guard   General bed mobility comments: Very minimal assist to  bring LE's over EOB.  Transfers Overall transfer level: Needs assistance Equipment used: Rolling walker (2 wheeled) Transfers: Sit to/from Stand Sit to Stand: Min assist;Mod assist         General transfer comment: cues for hand/foot placement to improve independence with transfer; pt noting increased blurry vision and dizziness with standing    Balance Overall balance assessment: Needs assistance Sitting-balance support: Feet supported Sitting balance-Leahy Scale: Good     Standing balance support: Bilateral upper extremity supported Standing balance-Leahy Scale: Fair                             ADL either performed or assessed with clinical judgement   ADL Overall ADL's : Needs assistance/impaired Eating/Feeding: Sitting;Independent   Grooming: Sitting;Independent   Upper Body Bathing: Sitting;Supervision/ safety   Lower Body Bathing: Sit to/from stand;Minimal assistance;Moderate assistance   Upper Body Dressing : Sitting;Supervision/safety   Lower Body Dressing: Sit to/from stand;Minimal assistance;Moderate assistance   Toilet Transfer: RW;Minimal assistance;Moderate assistance;BSC;Stand-pivot                   Vision Baseline Vision/History: Wears glasses Wears Glasses: Reading only Patient Visual Report: Blurring of vision(blurriness comes and goes, worsens with mobility) Vision Assessment?: Yes Eye Alignment: Within Functional Limits Ocular Range of Motion: Within Functional Limits Tracking/Visual Pursuits: Able to track stimulus in all quads without difficulty Visual Fields: No apparent deficits     Perception     Praxis      Pertinent Vitals/Pain Pain Assessment: 0-10 Pain Score: 3  Faces Pain Scale: Hurts a little bit Pain Location: headache, behind her eyes Pain Descriptors / Indicators: Headache Pain Intervention(s): Limited activity  within patient's tolerance;Monitored during session;Repositioned     Hand Dominance      Extremity/Trunk Assessment Upper Extremity Assessment Upper Extremity Assessment: Generalized weakness(Grossly 3+/5 bilaterally.  No asymmetry noted)   Lower Extremity Assessment Lower Extremity Assessment: Generalized weakness(Grossly 4/5 bilaterally.  No asymmetry noted.  No sensation loss reported)   Cervical / Trunk Assessment Cervical / Trunk Assessment: Normal   Communication Communication Communication: No difficulties   Cognition Arousal/Alertness: Awake/alert Behavior During Therapy: WFL for tasks assessed/performed Overall Cognitive Status: Within Functional Limits for tasks assessed                                 General Comments: Follows commands consistently.   General Comments  Coordination: UE: Finger to nose: L: 9 min with mild tremor, R: 8 min, both with hesitation at finger to finger due to visual deficit.  RAM: 10x with pt maintaining coordination for first 6 reps.  LE: unable to perform due to ROM deficits.    Exercises Other Exercises Other Exercises: Pt instructed in pursed lip breathing to minimize her feeling of SOB. vitals WNL   Shoulder Instructions      Home Living Family/patient expects to be discharged to:: Skilled nursing facility Living Arrangements: Alone                           Home Equipment: Walker - 2 wheels;Grab bars - toilet;Grab bars - tub/shower;Shower seat   Additional Comments: Pt lives by herself in 1 story home with threshold entry, walk in shower with shower chair and elevated commode.       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  Has a RW from previous TKA's.        OT Problem List: Decreased strength;Decreased knowledge of use of DME or AE;Impaired vision/perception;Decreased activity tolerance;Impaired balance (sitting and/or standing)      OT Treatment/Interventions: Self-care/ADL  training;Balance training;Therapeutic exercise;Therapeutic activities;Neuromuscular education;DME and/or AE instruction;Patient/family education;Visual/perceptual remediation/compensation    OT Goals(Current goals can be found in the care plan section) Acute Rehab OT Goals Patient Stated Goal: To participate in therapy for return of strength and walk for exercise again. OT Goal Formulation: With patient/family Time For Goal Achievement: 09/05/17 Potential to Achieve Goals: Good ADL Goals Pt Will Perform Lower Body Dressing: with supervision;sit to/from stand Pt Will Transfer to Toilet: with supervision;ambulating(LRAD for amb, comfort height toilet)  OT Frequency: Min 1X/week   Barriers to D/C:            Co-evaluation              AM-PAC PT "6 Clicks" Daily Activity     Outcome Measure Help from another person eating meals?: None Help from another person taking care of personal grooming?: None Help from another person toileting, which includes using toliet, bedpan, or urinal?: A Little Help from another person bathing (including washing, rinsing, drying)?: A Lot Help from another person to put on and taking off regular upper body clothing?: A Little Help from another person to put on and taking off regular lower body clothing?: A Lot 6 Click Score: 18   End of Session Equipment Utilized During Treatment: Gait belt;Rolling walker  Activity Tolerance: Patient tolerated treatment well Patient left: in bed;with call bell/phone within reach;with bed alarm set;with family/visitor present;Other (  comment)(neurologist in room)  OT Visit Diagnosis: Other abnormalities of gait and mobility (R26.89);Other symptoms and signs involving cognitive function                Time: 1344-1406 OT Time Calculation (min): 22 min Charges:  OT General Charges $OT Visit: 1 Visit OT Evaluation $OT Eval Moderate Complexity: 1 Mod OT Treatments $Self Care/Home Management : 8-22 mins  Jeni Salles, MPH, MS, OTR/L ascom [phone] 08/22/17, 2:40 PM

## 2017-08-22 NOTE — Progress Notes (Signed)
Patient requesting to not discontinue fluids due to concern of dehydration, notified Dr. Darvin Neighbours, received verbal order to keep fluids.

## 2017-08-22 NOTE — Evaluation (Signed)
Clinical/Bedside Swallow Evaluation Patient Details  Name: Haley Mooney MRN: [id number] Date of Birth: May 19, 1940  Today's Date: 08/22/2017 Time: SLP Start Time (ACUTE ONLY): 1200 SLP Stop Time (ACUTE ONLY): 6767 SLP Time Calculation (min) (ACUTE ONLY): 43 min  Past Medical History:  Past Medical History:  Diagnosis Date  . Anxiety   . Arthritis   . Bipolar disorder ([place])   . CAD (coronary artery disease)   . Cancer ([place])    SKIN  . Depression   . Diabetes mellitus without complication ([place])   . GERD (gastroesophageal reflux disease)    H/O  . Heart murmur   . Hypertension   . Neuromuscular disorder ([place])    NEUROPATHY  . Pain    CHEST OCC WITH EXERTION  . Pneumonia    IN PAST  . Shortness of breath dyspnea    Past Surgical History:  Past Surgical History:  Procedure Laterality Date  . ABDOMINAL HYSTERECTOMY    . APPENDECTOMY    . BACK SURGERY    . BLADDER SUSPENSION    . CATARACT EXTRACTION Left   . CATARACT EXTRACTION W/PHACO Left 08/08/2015   Procedure: CATARACT EXTRACTION PHACO AND INTRAOCULAR LENS PLACEMENT (IOC);  Surgeon: Birder Robson, MD;  Location: [facility] ORS;  Service: Ophthalmology;  Laterality: Left;  [place] 00:55 AP% 17.1 CDE 9.48 fluid pack lot # [id number]  . CATARACT EXTRACTION W/PHACO Right 09/05/2015   Procedure: CATARACT EXTRACTION PHACO AND INTRAOCULAR LENS PLACEMENT (IOC);  Surgeon: Birder Robson, MD;  Location: [facility] ORS;  Service: Ophthalmology;  Laterality: Right;  Lot# [id number] H US:00:43.0 AP%: 20.3 CDE: 8.74  . COLONOSCOPY    . CORONARY ANGIOPLASTY WITH STENT PLACEMENT     CATH DONE [facility] 6/17 DR PAULA MILLER  . EYE SURGERY    . JOINT REPLACEMENT    . TONSILLECTOMY     HPI:  Haley Mooney  is a 77 y.o. female with a known history of GERD, hypertension, diabetes mellitus type 2, skin cancer, coronary artery disease presented to the emergency room for nausea vomiting and dizziness.  Patient has dizziness going on for the last 2 weeks and she went  in and got evaluated at [facility].  She was discharged.  Patient still continued to have some dizziness and vomitings.  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  No complains of any slurred speech.  No weakness in any part of the body.  No tingling or numbness in any part of the body.  Hospitalist service was consulted for further care.  MRI 08/21/2017: 1. 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 No associated hemorrhage or mass effect. 2. Mild atrophy and chronic microvascular ischemia.   Assessment / Plan / Recommendation Clinical Impression  The patient is presenting with normal oropharyngeal swallowing and no overt changes in speech/language/cognition.  The patient is able to pull thin liquid via straw with no overt clinical indicators of aspiration- no cough, no throat clearing, no vocal quality change.  The patient is able to masticate a graham cracker with no difficulty and no overt indicators of oropharyngeal dysphagia.  The patient does not have her upper dentures and prefers a mechanical soft diet.  The patient continues to complain of nausea associated with oral intake and may not be able to eat very much until this is resolved.   The patient does not require further speech intervention at this time.  Will sign off at  this time.  Please re-consult speech if any concerns regarding swallowing or communication arise. SLP Visit Diagnosis: Dysphagia, oral phase (R13.11)    Aspiration Risk  No limitations    Diet Recommendation Dysphagia 3 (Mech soft);Thin liquid   Liquid Administration via: Straw Medication Administration: Whole meds with liquid Supervision: Patient able to self feed    Other  Recommendations     Follow up Recommendations        Frequency and Duration            Prognosis        Swallow Study   General Date of Onset: 08/21/17 HPI: Haley Mooney  is a 77 y.o. female with a known  history of GERD, hypertension, diabetes mellitus type 2, skin cancer, coronary artery disease presented to the emergency room for nausea vomiting and dizziness.  Patient has dizziness going on for the last 2 weeks and she went in and got evaluated at [facility].  She was discharged.  Patient still continued to have some dizziness and vomitings.  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  No complains of any slurred speech.  No weakness in any part of the body.  No tingling or numbness in any part of the body.  Hospitalist service was consulted for further care.  MRI 08/21/2017: 1. 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 No associated hemorrhage or mass effect. 2. Mild atrophy and chronic microvascular ischemia. Type of Study: Bedside Swallow Evaluation Diet Prior to this Study: NPO Behavior/Cognition: Alert;Cooperative;Pleasant mood Baseline Vocal Quality: Normal    Oral/Motor/Sensory Function Overall Oral Motor/Sensory Function: Within functional limits   Ice Chips     Thin Liquid Thin Liquid: Within functional limits Presentation: Self Fed;Straw    Nectar Thick     Honey Thick     Puree Puree: Within functional limits Presentation: Self Fed;Spoon   Solid     Solid: Within functional limits Presentation: [place], MS/CCC- SLP  Haley Mooney 08/22/2017,12:44 PM

## 2017-08-22 NOTE — Consult Note (Addendum)
Referring Physician: Candy Sledge MD    Chief Complaint: Dizziness  HPI: Haley Mooney is an 77 y.o. female seen in consultation with admitting physician for evaluation of sudden severe dizziness associated with vomiting and diarrhea. She report that onset of dizziness was around 10-11 am on 08/21/17.  Patient  describes symptoms of severe spinning sensation associated with imbalance, vomiting and diarrhea. She report that vomiting and diarrhea occurred concurrently causing her to feel extremily weak. She denies other associated focal neurological deficit. She lives alone and was not able to walk to her phone to call for help. Symptoms persisted for about an hour when she decided to crawl to her phone and called her neighbour who in turn called EMS. 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 Patient report that she has had similar episode  last month causing her to pass out and hit her head on the floor. She was evaluated in [facility] ED at [facility]  for dizziness and syncope with CT head which did not show any acute abnormality. Per [facility] ED report, Initial work-up was negative for any evidence of acute focal neurologic abnormality, infection, acute metabolic derangement related to diabetes or other conditions.  Telemetry monitoring however showed heart rate dropping to 40 during. She was placed on 4 day holter monitoring which did not show any occult arrhythmia. Patient state that her symptoms resolved spontaneously. Initial NIHSS of 0  Date last known well: Date: 08/21/2017 Time last known well: Time: 10:00 tPA Given: No: Outside time window  Past Medical History:  Diagnosis Date  . Anxiety   . Arthritis   . Bipolar disorder ([place])   . CAD (coronary artery disease)   . Cancer ([place])    SKIN  . Depression   . Diabetes mellitus without complication ([place])   . GERD (gastroesophageal reflux disease)    H/O  . Heart murmur   . Hypertension   . Neuromuscular disorder  ([place])    NEUROPATHY  . Pain    CHEST OCC WITH EXERTION  . Pneumonia    IN PAST  . Shortness of breath dyspnea     Past Surgical History:  Procedure Laterality Date  . ABDOMINAL HYSTERECTOMY    . APPENDECTOMY    . BACK SURGERY    . BLADDER SUSPENSION    . CATARACT EXTRACTION Left   . CATARACT EXTRACTION W/PHACO Left 08/08/2015   Procedure: CATARACT EXTRACTION PHACO AND INTRAOCULAR LENS PLACEMENT (IOC);  Surgeon: Birder Robson, MD;  Location: [facility] ORS;  Service: Ophthalmology;  Laterality: Left;  [place] 00:55 AP% 17.1 CDE 9.48 fluid pack lot # [id number]  . CATARACT EXTRACTION W/PHACO Right 09/05/2015   Procedure: CATARACT EXTRACTION PHACO AND INTRAOCULAR LENS PLACEMENT (IOC);  Surgeon: Birder Robson, MD;  Location: [facility] ORS;  Service: Ophthalmology;  Laterality: Right;  Lot# [id number] H US:00:43.0 AP%: 20.3 CDE: 8.74  . COLONOSCOPY    . CORONARY ANGIOPLASTY WITH STENT PLACEMENT     CATH DONE [facility] 6/17 DR PAULA MILLER  . EYE SURGERY    . JOINT REPLACEMENT    . TONSILLECTOMY      Family History  Problem Relation Age of Onset  . Heart failure Father    Social History:  reports that she has quit smoking. Her smoking use included e-cigarettes. She uses smokeless tobacco. She reports that she drinks alcohol. She reports that she does not use drugs.  Allergies:  Allergies  Allergen Reactions  . Ace Inhibitors Cough  Other reaction(s): OTHER  . Tetracyclines & Related Rash    Medications:  I have reviewed the patient's current medications. Scheduled: . ARIPiprazole  2.5 mg Oral Daily  . aspirin  300 mg Rectal Daily   Or  . aspirin EC  325 mg Oral Daily  . atorvastatin  80 mg Oral q1800  . enoxaparin (LOVENOX) injection  40 mg Subcutaneous Q12H  . gabapentin  300 mg Oral Daily  . insulin aspart  0-5 Units Subcutaneous QHS  . insulin aspart  0-9 Units Subcutaneous TID WC  . QUEtiapine  25 mg Oral QHS    ROS: History obtained from the patient   General ROS: negative  for - chills, fatigue, fever, night sweats, weight gain or weight loss Psychological ROS: negative for - behavioral disorder, hallucinations, memory difficulties, mood swings or suicidal ideation. Positive for mood disorder Ophthalmic ROS: negative for - blurry vision, double vision, eye pain or loss of vision ENT ROS: negative for - epistaxis, nasal discharge, oral lesions, sore throat, tinnitus. Positive for vertigo Allergy and Immunology ROS: negative for - hives or itchy/watery eyes Hematological and Lymphatic ROS: negative for - bleeding problems, bruising or swollen lymph nodes Endocrine ROS: negative for - galactorrhea, hair pattern changes, polydipsia/polyuria or temperature intolerance Respiratory ROS: negative for - cough, hemoptysis, shortness of breath or wheezing Cardiovascular ROS: negative for - chest pain, dyspnea on exertion, edema or irregular heartbeat Gastrointestinal ROS: negative for - abdominal pain, diarrhea, hematemesis. Positive for nausea/vomiting or diarrhea Genito-Urinary ROS: negative for - dysuria, hematuria, incontinence or urinary frequency/urgency Musculoskeletal ROS: negative for - joint swelling or muscular weakness Neurological ROS: as noted in HPI Dermatological ROS: negative for rash and skin lesion changes  Physical Examination: Blood pressure (!) 151/83, pulse 93, temperature 98.4 F (36.9 C), temperature source Oral, resp. rate 16, height 5\' 4"  (1.626 m), weight 107.5 kg, SpO2 95 %.  General Exam Patient looks appropriate of age, well built, nourished and appropriately groomed.  Cardiovascular Exam: S1, S2 heart sounds present murmur present Carotid exam revealed no bruit Lung exam was clear to auscultation ? Neurological Exam  Mental Status: Alert, Oriented to time, place, person and situation Attention span and concentration seemed appropriate Memory seemed OK. Intact naming, repetition, comprehension.  Followed 2 step commands - no  dysarthria Fund of knowledge seemed appropriate for age and health status.  Cranial Nerves: I. Olfactory not examined II: Visual fields were full. Pupils were equal, round and reactive to light and accommodation II,IV, VI: ptosis not present, extra-ocular motions intact bilaterally V,VII: smile symmetric, facial light touch sensation normal bilaterally VIII: Finger rub was heard symmetric in both ears IX, X: Palate and uvular movements are normal and oral sensations are OK, gag reflex deffered XI: Neck muscle strength and shoulder shrug is normal XII: midline tongue extension  Motor Exam: Tone is normal in all extremities Muscle strength in all extremities is 5/5. No abnormal movements, fasciculations or atrophy seen  Deep Tendon Reflexes: Right Biceps is 2+, Left Biceps is 2+ Right Triceps is 2+, Left Triceps is 2+ Right Brachioradialis is 2+, Left Brachioradialis is 2+ Right Knee Jerk is 2+, Left Knee Jerk is 2+ Right Ankle Jerk is 2+, Left Ankle Jerk is 2+ Right Toes are down going, Left Toes are down going  Sensory Exam: Sensations were intact to light touch in all extremities Vibration and proprioception are also intact  Co-ordination: Finger to nose is normal  Gait: Gait and station not tested due to symptoms of  dizziness and imbalance  Data Reviewed  Laboratory Studies:  Basic Metabolic Panel: Recent Labs  Lab 08/21/17 1628  NA 138  K 3.7  CL 106  CO2 22  GLUCOSE 305*  BUN 21  CREATININE 0.50  CALCIUM 8.8*    Liver Function Tests: Recent Labs  Lab 08/21/17 1628  AST 29  ALT 27  ALKPHOS 102  BILITOT 0.5  PROT 7.0  ALBUMIN 3.9   Recent Labs  Lab 08/21/17 1628  LIPASE 23   No results for input(s): AMMONIA in the last 168 hours.  CBC: Recent Labs  Lab 08/21/17 1628  WBC 13.6*  HGB 12.5  HCT 37.6  MCV 83.2  PLT 219    Cardiac Enzymes: Recent Labs  Lab 08/21/17 1628  TROPONINI <0.03    BNP: Invalid input(s):  POCBNP  CBG: Recent Labs  Lab 08/21/17 1848 08/21/17 2056 08/21/17 2339 08/22/17 0809  GLUCAP 246* 230* 165* 172*    Microbiology: No results found for this or any previous visit.  Coagulation Studies: No results for input(s): LABPROT, INR in the last 72 hours.  Urinalysis:  Recent Labs  Lab 08/21/17 1828  COLORURINE YELLOW*  LABSPEC 1.024  PHURINE 5.0  GLUCOSEU >=500*  HGBUR NEGATIVE  BILIRUBINUR NEGATIVE  KETONESUR 20*  PROTEINUR NEGATIVE  NITRITE NEGATIVE  LEUKOCYTESUR NEGATIVE    Lipid Panel:    Component Value Date/Time   CHOL 156 08/22/2017 0539   TRIG 112 08/22/2017 0539   HDL 53 08/22/2017 0539   CHOLHDL 2.9 08/22/2017 0539   VLDL 22 08/22/2017 0539   LDLCALC 81 08/22/2017 0539    HgbA1C: No results found for: HGBA1C  Urine Drug Screen:  No results found for: LABOPIA, COCAINSCRNUR, LABBENZ, AMPHETMU, THCU, LABBARB  Alcohol Level: No results for input(s): ETH in the last 168 hours.  Other results:  EKG  Reviewed by me at 1630 Heart rate 89 QRS 90 QTc 425 Normal sinus rhythm, no evidence of ischemia.  Mild prolongation of the QT is noted  Imaging: Dg Chest 2 View  Result Date: 08/21/2017 CLINICAL DATA:  Negative, vomiting and diarrhea. EXAM: CHEST - 2 VIEW COMPARISON:  05/26/2016 FINDINGS: Cardiomediastinal silhouette is normal. Mediastinal contours appear intact. Elevation of the right hemidiaphragm. There is no evidence of focal airspace consolidation, pleural effusion or pneumothorax. Osseous structures are without acute abnormality. Soft tissues are grossly normal. IMPRESSION: No active cardiopulmonary disease. Elevation of the right hemidiaphragm with uncertain significance. Electronically Signed   By: Fidela Salisbury M.D.   On: 08/21/2017 17:14   Mr Brain Wo Contrast  Result Date: 08/21/2017 CLINICAL DATA:  Episodic vertigo EXAM: MRI HEAD WITHOUT CONTRAST TECHNIQUE: Multiplanar, multiecho pulse sequences of the brain and surrounding  structures were obtained without intravenous contrast. COMPARISON:  None. 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 No associated hemorrhage or mass effect. The midline structures are normal. There are no old infarcts. Minimal white matter hyperintensity, nonspecific and commonly seen in asymptomatic patients of this age. Generalized atrophy without lobar predilection. Susceptibility-sensitive sequences show no chronic microhemorrhage or superficial siderosis. VASCULAR: Major intracranial arterial and venous sinus flow voids are preserved. SKULL AND UPPER CERVICAL SPINE: The visualized skull base, calvarium, upper cervical spine and extracranial soft tissues are normal. SINUSES/ORBITS: No fluid levels or advanced mucosal thickening. No mastoid or middle ear effusion. There are bilateral lens replacements. IMPRESSION: 1. 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 No associated hemorrhage or mass  effect. 2. Mild atrophy and chronic microvascular ischemia. Electronically Signed   By: Ulyses Jarred M.D.   On: 08/21/2017 19:24   Mr Jodene Nam Head/brain VE Cm  Result Date: 08/22/2017 CLINICAL DATA:  Initial evaluation for acute stroke. EXAM: MRA HEAD WITHOUT CONTRAST TECHNIQUE: Angiographic images of the Circle of Willis were obtained using MRA technique without intravenous contrast. COMPARISON:  Prior MRI from 08/21/2017. FINDINGS: ANTERIOR CIRCULATION: Distal cervical segments of the internal carotid arteries are widely patent with antegrade flow. Milder atheromatous irregularity within the petrous, cavernous, and supraclinoid ICAs without hemodynamically significant stenosis. Origin of the ophthalmic arteries patent. ICA termini widely patent. Right A1 widely patent. Left A1 hypoplastic and/or absent, accounting for the diminutive left ICA as compared to the right. Normal anterior communicating artery. Anterior cerebral arteries patent to  their distal aspects without stenosis. M1 segments patent without stenosis. Normal MCA bifurcations. No proximal M2 occlusion. Visualized distal MCA branches well perfused and symmetric. Small vessel atheromatous irregularity noted. POSTERIOR CIRCULATION: Dominant left vertebral artery mildly tortuous crossing the midline towards the vertebrobasilar junction. Vertebral arteries widely patent bilaterally without stenosis. Left PICA patent. Right PICA not visualized. Dominant right AICA. Basilar tortuous but widely patent to its distal aspect without stenosis. Superior cerebral arteries patent bilaterally. Both of the posterior cerebral artery supplied via the basilar. Scattered atheromatous irregularity within the PCAs bilaterally without hemodynamically significant stenosis. No intracranial aneurysm. IMPRESSION: 1. Negative intracranial MRA with no large vessel occlusion identified. No hemodynamically significant or correctable stenosis. 2. Distal small vessel atheromatous irregularity. 3. Hypoplastic/absent right A1, with the anterior cerebral artery supplied via the left carotid artery system. Electronically Signed   By: Jeannine Boga M.D.   On: 08/22/2017 01:47    Patient seen and examined.  Clinical course and management discussed.  Necessary edits performed.  I agree with the above.  Assessment and plan of care developed and discussed below.     Assessment: 77 y.o. 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 MRI/MRA brain personaly reviewed as above. US carotid bilaterally showed no hemodynamically significant stenosis. She has outpatient cardiac Holter monitoring for 4 days which did not show any abnormality or cause for stroke. She report that she was taking Aspirin 325 mg and Atorvstatin 80 mg  once a day prior to event. LDL 81.  Stroke Risk Factors - diabetes mellitus, hyperlipidemia, hypertension and CAD  Plan: 1. HgbA1c  2. PT consult, OT consult, Speech consult 3. Echocardiogram pending. If no cardiac source of emboli noted, further work up not indicated at this time.   4. Will switch from high dose Aspirin to dual therapy with Aspirin 81 mg/day and Plavix 75 mg /day with intensive management of vascular risk factor to keep systolic BP (SBP) <720 mm Hg (130 mm Hg if diabetic) and low density lipoprotein (LDL) <70 mg/dl, and lifestyle modification. Continue high potency statin. 5. NPO until RN stroke swallow screen 6. Telemetry monitoring 7. Frequent neuro checks 8. Meclizine 25mg  q8 hours prn for dizziness   Alexis Goodell, MD Neurology [phone]  08/22/2017, 9:29 AM

## 2017-08-23 LAB — GLUCOSE, CAPILLARY
Glucose-Capillary: 136 mg/dL — ABNORMAL HIGH (ref 70–99)
Glucose-Capillary: 209 mg/dL — ABNORMAL HIGH (ref 70–99)
Glucose-Capillary: 151 mg/dL — ABNORMAL HIGH (ref 70–99)
Glucose-Capillary: 182 mg/dL — ABNORMAL HIGH (ref 70–99)

## 2017-08-23 MED ORDER — CLOPIDOGREL BISULFATE 75 MG PO TABS
75.0000 mg | ORAL_TABLET | Freq: Every day | ORAL | Status: DC
  Administered 2017-08-23 – 2017-08-25 (×3): 75 mg via ORAL
  Filled 2017-08-23 (×3): qty 1

## 2017-08-23 MED ORDER — LORAZEPAM 2 MG/ML IJ SOLN
1.0000 mg | Freq: Once | INTRAMUSCULAR | Status: AC
  Administered 2017-08-23: 1 mg via INTRAVENOUS
  Filled 2017-08-23: qty 1

## 2017-08-23 MED ORDER — HYDRALAZINE HCL 20 MG/ML IJ SOLN: 10.0000 mg | Freq: Four times a day (QID) | INTRAMUSCULAR | Status: DC | PRN

## 2017-08-23 MED ORDER — VENLAFAXINE HCL 37.5 MG PO TABS
150.0000 mg | ORAL_TABLET | Freq: Every day | ORAL | Status: DC
  Administered 2017-08-23: 21:00:00 150 mg via ORAL
  Filled 2017-08-23 (×2): qty 4

## 2017-08-23 MED ORDER — MECLIZINE HCL 12.5 MG PO TABS
12.5000 mg | ORAL_TABLET | Freq: Three times a day (TID) | ORAL | Status: DC
  Administered 2017-08-23 – 2017-08-25 (×5): 12.5 mg via ORAL
  Filled 2017-08-23 (×9): qty 1

## 2017-08-23 MED ORDER — METOPROLOL SUCCINATE ER 25 MG PO TB24
25.0000 mg | ORAL_TABLET | Freq: Every day | ORAL | Status: DC
  Administered 2017-08-23 – 2017-08-25 (×3): 25 mg via ORAL
  Filled 2017-08-23 (×3): qty 1

## 2017-08-23 NOTE — Progress Notes (Signed)
Patient requested to notify Dr. Anselm Jungling for home medication effexor 150mg  once daily. Verbal order received.

## 2017-08-23 NOTE — Progress Notes (Signed)
Occupational Therapy Treatment Patient Details Name: Haley Mooney MRN: [id number] DOB: July 14, 1940 Today's Date: 08/23/2017    History of present illness Patient is a 77 year old female admitted for stroke workup following c/o dizziness and blurred vision.  PMH includes bipolar disorder, pneumonia, neuromuscular disorder, Htn, heart murmur, GERD, depression, CA, arthritis and depression.   OT comments  Pt did had infarct - pt this date cont to report blurry vision but it did not increase during session - pt very motivated to participate in therapy - report feeling stronger this date ,less SOB and hand /eye coordination did better this date . LB dressing was always harder - pt had cervical surgery about 3 months ago - R hand UE did fatigue with coornidation crossing midline after 5 reps - pt denies weakness in L non dominant arm - grip appear even. Pt show improvement in functional mobility and balance but still need min A ,  Did well with walker but during transfers without walker - reaching for railings or counter - pt can benefit from cont OT services   Follow Up Recommendations  SNF    Equipment Recommendations  3 in 1 bedside commode    Recommendations for Other Services      Precautions / Restrictions                                                                                                                                                Tx session Other Exercises Other Exercises: Supine to sit on R side of bed - S ; Sit to stand of EOB Min A - reaching for counter ; EOB transfer to chair and back min A , Sit to stand from EOB at [facility] 5 x - reach for counter for balance ; Sit <> stand chair 10 x Supervision  Other Exercises: Functional mobility to bathroom , toiletting using RW - no LOB , slow but steady - min V/C for hand placement  Other Exercises: LB dressing mod A for socks , pants Min A - clothing management  with toiletting SBA - standing at sink washing hands - close S  Other Exercises: Pt report still blurry vision but same in supine and during session - did not increase - no dizziness - no increase SOB this date  Other Exercises: eye hand coordination - improve compare to eval yesterday - during crossing midline - L hand fatigue after 5 reps - R hand WNL - pt report she is R hand dominant and denies feeling weakness in L - grip strength seams even             Home Living  Prior Functioning/Environment              Frequency  Min 3X/week        Progress Toward Goals  OT Goals(current goals can now be found in the care plan section)  Progress towards OT goals: Progressing toward goals  Acute Rehab OT Goals Patient Stated Goal: To participate in therapy for return of strength , go home  OT Goal Formulation: With patient Time For Goal Achievement: 09/05/17 Potential to Achieve Goals: Good  Plan Discharge plan remains appropriate    Co-evaluation                 AM-PAC PT "6 Clicks" Daily Activity     Outcome Measure                    End of Session Equipment Utilized During Treatment: Gait belt;Rolling walker  OT Visit Diagnosis: Low vision, both eyes (H54.2);Muscle weakness (generalized) (M62.81);Unsteadiness on feet (R26.81)   Activity Tolerance Patient tolerated treatment well   Patient Left in chair;with call bell/phone within reach;with chair alarm set   Nurse Communication          Time: 8185-6314 OT Time Calculation (min): 44 min  Charges: OT Treatments $Self Care/Home Management : 8-22 mins $Therapeutic Activity: 8-22 mins $Therapeutic Exercise: 8-22 mins     [name] OTR/L,CLT 08/23/2017, 12:48 PM

## 2017-08-23 NOTE — Plan of Care (Signed)
  Problem: Education: Goal: Knowledge of disease or condition will improve Outcome: Progressing Goal: Knowledge of secondary prevention will improve Outcome: Progressing Goal: Knowledge of patient specific risk factors addressed and post discharge goals established will improve Outcome: Progressing   Problem: Coping: Goal: Will verbalize positive feelings about self Outcome: Progressing   Problem: Self-Care: Goal: Ability to participate in self-care as condition permits will improve Outcome: Progressing Goal: Verbalization of feelings and concerns over difficulty with self-care will improve Outcome: Progressing Goal: Ability to communicate needs accurately will improve Outcome: Progressing   Problem: Nutrition: Goal: Risk of aspiration will decrease Outcome: Progressing   Problem: Ischemic Stroke/TIA Tissue Perfusion: Goal: Complications of ischemic stroke/TIA will be minimized Outcome: Progressing   Problem: Education: Goal: Knowledge of General Education information will improve Description Including pain rating scale, medication(s)/side effects and non-pharmacologic comfort measures Outcome: Progressing   Problem: Clinical Measurements: Goal: Ability to maintain clinical measurements within normal limits will improve Outcome: Progressing Goal: Will remain free from infection Outcome: Progressing Goal: Diagnostic test results will improve Outcome: Progressing Goal: Respiratory complications will improve Outcome: Progressing Goal: Cardiovascular complication will be avoided Outcome: Progressing   Problem: Activity: Goal: Risk for activity intolerance will decrease Outcome: Progressing   Problem: Elimination: Goal: Will not experience complications related to bowel motility Outcome: Progressing   Problem: Pain Managment: Goal: General experience of comfort will improve Outcome: Progressing   Problem: Safety: Goal: Ability to remain free from injury will  improve Outcome: Progressing   Problem: Skin Integrity: Goal: Risk for impaired skin integrity will decrease Outcome: Progressing

## 2017-08-23 NOTE — Clinical Social Work Note (Signed)
CSW met with patient to provide bed offers. The patient shared that she will think about her options, but she would like to wait on her decision until [facility] has offered or declined. CSW is following.  Santiago Bumpers, MSW, Latanya Presser [phone]

## 2017-08-24 LAB — GLUCOSE, CAPILLARY
GLUCOSE-CAPILLARY: 237 mg/dL — AB (ref 70–99)
GLUCOSE-CAPILLARY: 209 mg/dL — AB (ref 70–99)
Glucose-Capillary: 191 mg/dL — ABNORMAL HIGH (ref 70–99)
Glucose-Capillary: 261 mg/dL — ABNORMAL HIGH (ref 70–99)
Glucose-Capillary: 296 mg/dL — ABNORMAL HIGH (ref 70–99)

## 2017-08-24 MED ORDER — AMLODIPINE BESYLATE 10 MG PO TABS
10.0000 mg | ORAL_TABLET | Freq: Every day | ORAL | Status: DC
  Administered 2017-08-24 – 2017-08-25 (×2): 10 mg via ORAL
  Filled 2017-08-24 (×2): qty 1

## 2017-08-24 NOTE — Progress Notes (Signed)
Haley Mooney at [place] NAME: Haley Mooney    MR#:  [id number]  DATE OF BIRTH:  14-Jul-1940  SUBJECTIVE:  CHIEF COMPLAINT:   Chief Complaint  Patient presents with  . Nausea  . Emesis  . Weakness   Continues to have dizziness and weakness. Much stronger now.  REVIEW OF SYSTEMS:    Review of Systems  Constitutional: Positive for malaise/fatigue. Negative for chills and fever.  HENT: Negative for sore throat.   Eyes: Negative for blurred vision, double vision and pain.  Respiratory: Negative for cough, hemoptysis, shortness of breath and wheezing.   Cardiovascular: Negative for chest pain, palpitations, orthopnea and leg swelling.  Gastrointestinal: Negative for abdominal pain, constipation, diarrhea, heartburn, nausea and vomiting.  Genitourinary: Negative for dysuria and hematuria.  Musculoskeletal: Negative for back pain and joint pain.  Skin: Negative for rash.  Neurological: Positive for dizziness. Negative for sensory change, speech change, focal weakness and headaches.  Endo/Heme/Allergies: Does not bruise/bleed easily.  Psychiatric/Behavioral: Negative for depression. The patient is not nervous/anxious.     DRUG ALLERGIES:   Allergies  Allergen Reactions  . Ace Inhibitors Cough    Other reaction(s): OTHER  . Tetracyclines & Related Rash    VITALS:  Blood pressure (!) 162/94, pulse 84, temperature 98.7 F (37.1 C), temperature source Oral, resp. rate 16, height 5\' 4"  (1.626 m), weight 107.5 kg, SpO2 99 %.  PHYSICAL EXAMINATION:   Physical Exam  GENERAL:  77 y.o.-year-old patient lying in the bed with no acute distress.  EYES: Pupils equal, round, reactive to light and accommodation. No scleral icterus. Extraocular muscles intact.  HEENT: Head atraumatic, normocephalic. Oropharynx and nasopharynx clear.  NECK:  Supple, no jugular venous distention. No thyroid enlargement, no tenderness.  LUNGS: Normal breath sounds  bilaterally, no wheezing, rales, rhonchi. No use of accessory muscles of respiration.  CARDIOVASCULAR: S1, S2 normal. No murmurs, rubs, or gallops.  ABDOMEN: Soft, nontender, nondistended. Bowel sounds present. No organomegaly or mass.  EXTREMITIES: No cyanosis, clubbing or edema b/l.    NEUROLOGIC: Cranial nerves II through XII are intact. No focal Motor or sensory deficits b/l.   PSYCHIATRIC: The patient is alert and oriented x 3.  SKIN: No obvious rash, lesion, or ulcer.   LABORATORY PANEL:   CBC Recent Labs  Lab 08/21/17 1628  WBC 13.6*  HGB 12.5  HCT 37.6  PLT 219   ------------------------------------------------------------------------------------------------------------------ Chemistries  Recent Labs  Lab 08/21/17 1628  NA 138  K 3.7  CL 106  CO2 22  GLUCOSE 305*  BUN 21  CREATININE 0.50  CALCIUM 8.8*  AST 29  ALT 27  ALKPHOS 102  BILITOT 0.5   ------------------------------------------------------------------------------------------------------------------  Cardiac Enzymes Recent Labs  Lab 08/21/17 1628  TROPONINI <0.03   ------------------------------------------------------------------------------------------------------------------  RADIOLOGY:  US Carotid Bilateral (at [facility])  Result Date: 08/22/2017 CLINICAL DATA:  77 year old female with history of cerebrovascular accident. Cardiovascular risk factors include hypertension, stroke/TIA, hyperlipidemia, diabetes, tobacco use EXAM: BILATERAL CAROTID DUPLEX ULTRASOUND TECHNIQUE: Pearline Cables scale imaging, color Doppler and duplex ultrasound were performed of bilateral carotid and vertebral arteries in the neck. COMPARISON:  None. FINDINGS: Criteria: Quantification of carotid stenosis is based on velocity parameters that correlate the residual internal carotid diameter with NASCET-based stenosis levels, using the diameter of the distal internal carotid lumen as the denominator for stenosis measurement.  The following velocity measurements were obtained: RIGHT ICA:  Systolic 694 cm/sec, Diastolic 34 cm/sec CCA:  84 cm/sec SYSTOLIC  ICA/CCA RATIO:  1.5 ECA:  102 cm/sec LEFT ICA:  Systolic 096 cm/sec, Diastolic 28 cm/sec CCA:  98 cm/sec SYSTOLIC ICA/CCA RATIO:  1.1 ECA:  97 cm/sec Right Brachial SBP: Not acquired Left Brachial SBP: Not acquired RIGHT CAROTID ARTERY: No significant calcifications of the right common carotid artery. Intermediate waveform maintained. Heterogeneous and partially calcified plaque at the right carotid bifurcation. No significant lumen shadowing. Low resistance waveform of the right ICA. No significant tortuosity. RIGHT VERTEBRAL ARTERY: Antegrade flow with low resistance waveform. LEFT CAROTID ARTERY: No significant calcifications of the left common carotid artery. Intermediate waveform maintained. Heterogeneous and partially calcified plaque at the left carotid bifurcation without significant lumen shadowing. Low resistance waveform of the left ICA. No significant tortuosity. LEFT VERTEBRAL ARTERY:  Antegrade flow with low resistance waveform. IMPRESSION: Color duplex indicates minimal heterogeneous and calcified plaque, with no hemodynamically significant stenosis by duplex criteria in the extracranial cerebrovascular circulation. Signed, Dulcy Fanny. Dellia Nims, RPVI [facility] Radiology Electronically Signed   By: Corrie Mckusick D.O.   On: 08/22/2017 09:53     ASSESSMENT AND PLAN:   * 77 year old female patient with history of coronary disease, hypertension, diabetes mellitus type 2, GERD presented to the emergency room with nausea vomiting and dizziness  - Subacute ischemic CVA, left pons Aspirin 325 mg daily, added plavix. Neurology consulted.  Done carotid ultrasound and echocardiogram- no significant blockage on carotids and no source of emboli on echo. 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  -Type 2 diabetes mellitus Diet controlled with sliding scale coverage with insulin  -DVT prophylaxis Lovenox daily  - PT has suggested SNF after discharge for rehab, but pt cont to improve, and I have encouraged to do more exercise while in bed or chair. She is motivated to try to get stronger and go home- if possible by Monday.  All the records are reviewed and case discussed with Care Management/Social Worker Management plans discussed with the patient, family and they are in agreement.  CODE STATUS: DNR  DVT Prophylaxis: SCDs  TOTAL TIME TAKING CARE OF THIS PATIENT: 35 minutes.   POSSIBLE D/C IN 1-2 DAYS, DEPENDING ON CLINICAL CONDITION.  Vaughan Basta M.D on 08/24/2017 at 8:32 AM  Between 7am to 6pm - Pager - [phone]  After 6pm go to [email or web address] - password [email or web address] [facility]  Office  [phone]  CC: Primary care physician; Esmond Harps, MD  Note: This dictation was prepared with Dragon dictation along with smaller phrase technology. Any transcriptional errors that result from this process are unintentional.

## 2017-08-24 NOTE — Progress Notes (Signed)
[place] [facility] [place] NAME: Haley Mooney    MR#:  [id number]  DATE OF BIRTH:  04/17/1940  SUBJECTIVE:  CHIEF COMPLAINT:   Chief Complaint  Patient presents with  . Nausea  . Emesis  . Weakness   Continues to have dizziness and weakness. Much stronger now.  REVIEW OF SYSTEMS:    Review of Systems  Constitutional: Positive for malaise/fatigue. Negative for chills and fever.  HENT: Negative for sore throat.   Eyes: Negative for blurred vision, double vision and pain.  Respiratory: Negative for cough, hemoptysis, shortness of breath and wheezing.   Cardiovascular: Negative for chest pain, palpitations, orthopnea and leg swelling.  Gastrointestinal: Negative for abdominal pain, constipation, diarrhea, heartburn, nausea and vomiting.  Genitourinary: Negative for dysuria and hematuria.  Musculoskeletal: Negative for back pain and joint pain.  Skin: Negative for rash.  Neurological: Positive for dizziness. Negative for sensory change, speech change, focal weakness and headaches.  Endo/Heme/Allergies: Does not bruise/bleed easily.  Psychiatric/Behavioral: Negative for depression. The patient is not nervous/anxious.     DRUG ALLERGIES:   Allergies  Allergen Reactions  . Ace Inhibitors Cough    Other reaction(s): OTHER  . Tetracyclines & Related Rash    VITALS:  Blood pressure (!) 156/77, pulse 64, temperature 97.8 F (36.6 C), temperature source Oral, resp. rate 18, height 5\' 4"  (1.626 m), weight 107.5 kg, SpO2 99 %.  PHYSICAL EXAMINATION:   Physical Exam  GENERAL:  77 y.o.-year-old patient lying in the bed with no acute distress.  EYES: Pupils equal, round, reactive to light and accommodation. No scleral icterus. Extraocular muscles intact.  HEENT: Head atraumatic, normocephalic. Oropharynx and nasopharynx clear.  NECK:  Supple, no jugular venous distention. No thyroid enlargement, no tenderness.  LUNGS: Normal breath sounds  bilaterally, no wheezing, rales, rhonchi. No use of accessory muscles of respiration.  CARDIOVASCULAR: S1, S2 normal. No murmurs, rubs, or gallops.  ABDOMEN: Soft, nontender, nondistended. Bowel sounds present. No organomegaly or mass.  EXTREMITIES: No cyanosis, clubbing or edema b/l.    NEUROLOGIC: Cranial nerves II through XII are intact. No focal Motor or sensory deficits b/l.   PSYCHIATRIC: The patient is alert and oriented x 3.  SKIN: No obvious rash, lesion, or ulcer.   LABORATORY PANEL:   CBC Recent Labs  Lab 08/21/17 1628  WBC 13.6*  HGB 12.5  HCT 37.6  PLT 219   ------------------------------------------------------------------------------------------------------------------ Chemistries  Recent Labs  Lab 08/21/17 1628  NA 138  K 3.7  CL 106  CO2 22  GLUCOSE 305*  BUN 21  CREATININE 0.50  CALCIUM 8.8*  AST 29  ALT 27  ALKPHOS 102  BILITOT 0.5   ------------------------------------------------------------------------------------------------------------------  Cardiac Enzymes Recent Labs  Lab 08/21/17 1628  TROPONINI <0.03   ------------------------------------------------------------------------------------------------------------------  RADIOLOGY:  No results found.   ASSESSMENT AND PLAN:   * 77 year old female patient with history of coronary disease, hypertension, diabetes mellitus type 2, GERD presented to the emergency room with nausea vomiting and dizziness  - Subacute ischemic CVA, left pons Aspirin 325 mg daily, added plavix. Neurology consulted.  Done carotid ultrasound and echocardiogram- no significant blockage on carotids and no source of emboli on echo. 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  pt is improving.  -Type 2 diabetes mellitus Diet controlled with sliding scale coverage with insulin  - hypertension   Started on Amlodipine.  -DVT prophylaxis Lovenox daily  - PT has suggested SNF  after discharge  for rehab, but pt cont to improve, and I have encouraged to do more exercise while in bed or chair. She is motivated to try to get stronger and go home- if possible by Monday. Still awaited insurance approval for rehab, but she may not need it by tomorrow.  All the records are reviewed and case discussed with Care Management/Social Worker Management plans discussed with the patient, family and they are in agreement.  CODE STATUS: DNR  DVT Prophylaxis: SCDs  TOTAL TIME TAKING CARE OF THIS PATIENT: 35 minutes.   POSSIBLE D/C IN 1-2 DAYS, DEPENDING ON CLINICAL CONDITION.  Vaughan Basta M.D on 08/24/2017 at 4:06 PM  Between 7am to 6pm - Pager - [phone]  After 6pm go to [email or web address] - password [email or web address] [facility]  Office  [phone]  CC: Primary care physician; Esmond Harps, MD  Note: This dictation was prepared with Dragon dictation along with smaller phrase technology. Any transcriptional errors that result from this process are unintentional.

## 2017-08-24 NOTE — Progress Notes (Signed)
Physical Therapy Treatment Patient Details Name: Haley Mooney MRN: [id number] DOB: Jan 09, 1940 Today's Date: 08/24/2017    History of Present Illness Patient is a 77 year old female admitted for stroke workup following c/o dizziness and blurred vision.  PMH includes bipolar disorder, pneumonia, neuromuscular disorder, Htn, heart murmur, GERD, depression, CA, arthritis and depression.    PT Comments    Pt was seen for evaluation of current status and upgraded to 7x per week as she has acute pons stroke per MRI.  Her tolerance for gait is much better but still feeling very weak with a decrease in her O2 sats with effort.  Follow acutely for strength and balance to minimize her stay in SNF.  Pt is motivated to get better and working very well with PT, but did return to bed after session due to how she is feeling.   Follow Up Recommendations  SNF     Equipment Recommendations  None recommended by PT    Recommendations for Other Services       Precautions / Restrictions Precautions Precautions: Fall Restrictions Weight Bearing Restrictions: No    Mobility  Bed Mobility Overal bed mobility: Needs Assistance Bed Mobility: Supine to Sit;Sit to Supine     Supine to sit: Supervision;HOB elevated Sit to supine: Supervision   General bed mobility comments: care to be sure pt is not unsteady  Transfers Overall transfer level: Needs assistance Equipment used: Rolling walker (2 wheeled) Transfers: Sit to/from Stand Sit to Stand: Min guard;Min assist         General transfer comment: pt is using good hand placement  Ambulation/Gait Ambulation/Gait assistance: Min guard Gait Distance (Feet): 70 Feet(35 x 2) Assistive device: Rolling walker (2 wheeled) Gait Pattern/deviations: Step-through pattern;Step-to pattern;Decreased stride length;Wide base of support;Trunk flexed Gait velocity: reduced Gait velocity interpretation: <1.8 ft/sec, indicate of risk for recurrent  falls General Gait Details: careful due to her feeling of weakness and note O2 sats drop from 97 to 93% during gait   Stairs Stairs: (ramped at home)           Wheelchair Mobility    Modified Rankin (Stroke Patients Only) Modified Rankin (Stroke Patients Only) Pre-Morbid Rankin Score: No symptoms Modified Rankin: Moderate disability     Balance Overall balance assessment: Needs assistance Sitting-balance support: Feet supported Sitting balance-Leahy Scale: Good     Standing balance support: Bilateral upper extremity supported;During functional activity Standing balance-Leahy Scale: Fair Standing balance comment: less than fair dynamic balance                            Cognition Arousal/Alertness: Awake/alert Behavior During Therapy: WFL for tasks assessed/performed Overall Cognitive Status: Within Functional Limits for tasks assessed                                        Exercises General Exercises - Lower Extremity Ankle Circles/Pumps: AROM;Both;5 reps Quad Sets: AROM;Both;10 reps Gluteal Sets: AROM;Both;10 reps Heel Slides: AROM;Both;10 reps    General Comments General comments (skin integrity, edema, etc.): O2 sats vary during gait from 97 to 93% with shorter distances used as pt was feeling unsteady      Pertinent Vitals/Pain Pain Assessment: No/denies pain    Home Living                      Prior  Function            PT Goals (current goals can now be found in the care plan section) Acute Rehab PT Goals Patient Stated Goal: to get stronger and go home PT Goal Formulation: With patient Progress towards PT goals: Progressing toward goals    Frequency    7X/week      PT Plan Discharge plan needs to be updated    Co-evaluation              AM-PAC PT "6 Clicks" Daily Activity  Outcome Measure  Difficulty turning over in bed (including adjusting bedclothes, sheets and blankets)?: A  Little Difficulty moving from lying on back to sitting on the side of the bed? : A Little Difficulty sitting down on and standing up from a chair with arms (e.g., wheelchair, bedside commode, etc,.)?: Unable Help needed moving to and from a bed to chair (including a wheelchair)?: A Little Help needed walking in hospital room?: A Little Help needed climbing 3-5 steps with a railing? : Total 6 Click Score: 14    End of Session Equipment Utilized During Treatment: Gait belt Activity Tolerance: Treatment limited secondary to medical complications (Comment);Patient limited by fatigue Patient left: in bed;with call bell/phone within reach;with bed alarm set Nurse Communication: Mobility status PT Visit Diagnosis: Unsteadiness on feet (R26.81);Dizziness and giddiness (R42);Muscle weakness (generalized) (M62.81)     Time: 0934-1000 PT Time Calculation (min) (ACUTE ONLY): 26 min  Charges:  $Gait Training: 8-22 mins $Therapeutic Exercise: 8-22 mins                      Ramond Dial 08/24/2017, 10:38 AM   Mee Hives, PT MS Acute Rehab Dept. Number: [place] and [place]

## 2017-08-25 LAB — GLUCOSE, CAPILLARY
Glucose-Capillary: 267 mg/dL — ABNORMAL HIGH (ref 70–99)
Glucose-Capillary: 173 mg/dL — ABNORMAL HIGH (ref 70–99)
Glucose-Capillary: 214 mg/dL — ABNORMAL HIGH (ref 70–99)

## 2017-08-25 MED ORDER — ASPIRIN EC 81 MG PO TBEC: 81.0000 mg | DELAYED_RELEASE_TABLET | Freq: Every day | ORAL | Status: DC

## 2017-08-25 MED ORDER — METFORMIN HCL 500 MG PO TABS
1000.0000 mg | ORAL_TABLET | Freq: Two times a day (BID) | ORAL | Status: DC
  Filled 2017-08-25: qty 2

## 2017-08-25 MED ORDER — MECLIZINE HCL 12.5 MG PO TABS: 12.5000 mg | ORAL_TABLET | Freq: Three times a day (TID) | ORAL | 0 refills | Status: DC

## 2017-08-25 MED ORDER — CLOPIDOGREL BISULFATE 75 MG PO TABS: 75.0000 mg | ORAL_TABLET | Freq: Every day | ORAL | 0 refills | Status: AC

## 2017-08-25 MED ORDER — AMLODIPINE BESYLATE 10 MG PO TABS: 10.0000 mg | ORAL_TABLET | Freq: Every day | ORAL | 0 refills | Status: DC

## 2017-08-25 NOTE — Progress Notes (Signed)
Physical Therapy Treatment Patient Details Name: Haley Mooney MRN: [id number] DOB: 1940-01-14 Today's Date: 08/25/2017    History of Present Illness Patient is a 77 year old female admitted for stroke workup following c/o dizziness and blurred vision.  PMH includes bipolar disorder, pneumonia, neuromuscular disorder, Htn, heart murmur, GERD, depression, CA, arthritis and depression.    PT Comments    Pt agreeable to PT; denies pain. Notes some mild nausea and dizziness remains during upright activities/function. Pt has mild dizziness with initial stand that subsides within 30 seconds. Progresses ambulation distance to 100 ft today; however, with returning dizziness post walk and fatigue. Gait speed is slow (0.77 ft/sec), mildly unstable/unconfident appearing with decreased rhythm/fluidity. Pt wishes return to bed post walk. Unable/declines up in chair or participation with further exercise due to fatigue. At time of document, pt has current discharge order to skilled nursing facility to continue rehab efforts to allow for improved strength, endurance, balance and quality of ambulation to allow for improved function and decreased fall risk.   Follow Up Recommendations  SNF     Equipment Recommendations       Recommendations for Other Services       Precautions / Restrictions Precautions Precautions: Fall Restrictions Weight Bearing Restrictions: No    Mobility  Bed Mobility Overal bed mobility: Modified Independent             General bed mobility comments: slow, assist only to remove covers from feet.   Transfers Overall transfer level: Needs assistance Equipment used: Rolling walker (2 wheeled) Transfers: Sit to/from Stand Sit to Stand: Min guard         General transfer comment: good hand placement; slow to rise. Mild dizziness with stand the subsides withing 30 seconds  Ambulation/Gait Ambulation/Gait assistance: Min guard Gait Distance (Feet): 100  Feet Assistive device: Rolling walker (2 wheeled) Gait Pattern/deviations: Step-through pattern;Wide base of support;Trunk flexed;Decreased stride length;Decreased step length - left Gait velocity: reduced Gait velocity interpretation: <1.31 ft/sec, indicative of household ambulator(0.77 ft/sec) General Gait Details: slow with decreased cadence/rhythm. Subjectively below baseline. Fatigued post walk. HR increases from 68 to 101 bpm   Stairs Stairs: (too fatigued; pt reports several partial STE at home)           Wheelchair Mobility    Modified Rankin (Stroke Patients Only)       Balance Overall balance assessment: Needs assistance Sitting-balance support: Feet supported Sitting balance-Leahy Scale: Good     Standing balance support: Bilateral upper extremity supported Standing balance-Leahy Scale: Fair                              Cognition Arousal/Alertness: Awake/alert Behavior During Therapy: WFL for tasks assessed/performed Overall Cognitive Status: Within Functional Limits for tasks assessed                                        Exercises      General Comments        Pertinent Vitals/Pain Pain Assessment: No/denies pain    Home Living                      Prior Function            PT Goals (current goals can now be found in the care plan section) Progress towards PT goals: Progressing toward  goals    Frequency    7X/week      PT Plan Current plan remains appropriate    Co-evaluation              AM-PAC PT "6 Clicks" Daily Activity  Outcome Measure  Difficulty turning over in bed (including adjusting bedclothes, sheets and blankets)?: A Little Difficulty moving from lying on back to sitting on the side of the bed? : A Little Difficulty sitting down on and standing up from a chair with arms (e.g., wheelchair, bedside commode, etc,.)?: Unable Help needed moving to and from a bed to chair  (including a wheelchair)?: A Little Help needed walking in hospital room?: A Little Help needed climbing 3-5 steps with a railing? : A Lot 6 Click Score: 15    End of Session Equipment Utilized During Treatment: Gait belt Activity Tolerance: Patient limited by fatigue Patient left: with call bell/phone within reach;with bed alarm set;in bed   PT Visit Diagnosis: Unsteadiness on feet (R26.81);Dizziness and giddiness (R42);Muscle weakness (generalized) (M62.81)     Time: 1040-1101 PT Time Calculation (min) (ACUTE ONLY): 21 min  Charges:  $Gait Training: 8-22 mins                      Larae Grooms, PTA 08/25/2017, 11:11 AM

## 2017-08-25 NOTE — Clinical Social Work Note (Signed)
Patient is medically ready for discharge today to [facility]. CSW notified patient and daughter Armond Hang [phone] of discharge today. Daughter will transport. RN to call report.   [place], [place]

## 2017-08-25 NOTE — Progress Notes (Signed)
Report called to [phone]. Report provided to [facility]. No questions at close of report. Awaiting daughter to transfer to facility

## 2017-08-25 NOTE — Clinical Social Work Note (Signed)
CSW notified patient's daughter Armond Hang [phone] that [facility] can accept patient today and that patient will be ready for discharge today. CSW will continue to follow for discharge planning.   [place], [place]

## 2017-08-25 NOTE — Clinical Social Work Placement (Signed)
   CLINICAL SOCIAL WORK PLACEMENT  NOTE  Date:  08/25/2017  Patient Details  Name: Haley Mooney MRN: [id number] Date of Birth: 1940/06/10  Clinical Social Work is seeking post-discharge placement for this patient at the [place] level of care (*CSW will initial, date and re-position this form in  chart as items are completed):  Yes   Patient/family provided with [facility] Work Department's list of facilities offering this level of care within the geographic area requested by the patient (or if unable, by the patient's family).  Yes   Patient/family informed of their freedom to choose among providers that offer the needed level of care, that participate in Medicare, Medicaid or managed care program needed by the patient, have an available bed and are willing to accept the patient.  Yes   Patient/family informed of [facility]'s ownership interest in [facility] and [facility], as well as of the fact that they are under no obligation to receive care at these facilities.  PASRR submitted to EDS on 08/22/17     PASRR number received on 08/22/17     Existing PASRR number confirmed on       FL2 transmitted to all facilities in geographic area requested by pt/family on 08/22/17     FL2 transmitted to all facilities within larger geographic area on       Patient informed that his/her managed care company has contracts with or will negotiate with certain facilities, including the following:        Yes   Patient/family informed of bed offers received.  Patient chooses bed at [facility]     Physician recommends and patient chooses bed at [facility])    Patient to be transferred to [facility] on 08/25/17.  Patient to be transferred to facility by EMS     Patient family notified on 08/25/17 of transfer.  Name of family member notified:  Jessica Priest- daughter      PHYSICIAN       Additional Comment:     _______________________________________________ Annamaria Boots, [place] 08/25/2017, 2:34 PM

## 2017-08-25 NOTE — Discharge Summary (Signed)
[facility] [place] NAME: Haley Mooney    MR#:  [id number]  DATE OF BIRTH:  1941/01/07  DATE OF ADMISSION:  08/21/2017 ADMITTING PHYSICIAN: Saundra Shelling, MD  DATE OF DISCHARGE: 08/25/2017   PRIMARY CARE PHYSICIAN: Esmond Harps, MD    ADMISSION DIAGNOSIS:  Acute ischemic stroke ([place]) [I63.9]  DISCHARGE DIAGNOSIS:  Active Problems:   CVA (cerebral vascular accident) ([place])   SECONDARY DIAGNOSIS:   Past Medical History:  Diagnosis Date  . Anxiety   . Arthritis   . Bipolar disorder ([place])   . CAD (coronary artery disease)   . Cancer ([place])    SKIN  . Depression   . Diabetes mellitus without complication ([place])   . GERD (gastroesophageal reflux disease)    H/O  . Heart murmur   . Hypertension   . Neuromuscular disorder ([place])    NEUROPATHY  . Pain    CHEST OCC WITH EXERTION  . Pneumonia    IN PAST  . Shortness of breath dyspnea     HOSPITAL COURSE:   * 77 year old female patient with history of coronary disease, hypertension, diabetes mellitus type 2, GERD presented to the emergency room with nausea vomiting and dizziness  - Subacuteischemic CVA, left pons Aspirin 325 mgdaily, added plavix. Neurology consulted.  Done carotid ultrasound and echocardiogram- no significant blockage on carotids and no source of emboli on echo. LDL < 100, on atorvastatin- advised about dietary control.  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  pt is improving.  -Type 2 diabetes mellitus Diet controlled with sliding scale coverage with insulin  She was on multiple meds, but with diet in hospital , blood sugar not very high, cut down to metformin and lantus only.  - hypertension   Started on Amlodipine.  -DVT prophylaxis Lovenox daily  Pt cont to be weak, slow and high risk of fall, so concerned about going home as she lives alone. She has improved and able to walk with a walker, but prefer to go to  rehab for a few days to get strength and balance.  DISCHARGE CONDITIONS:   Stable.  CONSULTS OBTAINED:  Treatment Team:  Alexis Goodell, MD  DRUG ALLERGIES:   Allergies  Allergen Reactions  . Ace Inhibitors Cough    Other reaction(s): OTHER  . Tetracyclines & Related Rash    DISCHARGE MEDICATIONS:   Allergies as of 08/25/2017      Reactions   Ace Inhibitors Cough   Other reaction(s): OTHER   Tetracyclines & Related Rash      Medication List    STOP taking these medications   amoxicillin-clavulanate 875-125 MG tablet Commonly known as:  AUGMENTIN   glipiZIDE 10 MG tablet Commonly known as:  GLUCOTROL   predniSONE 50 MG tablet Commonly known as:  DELTASONE   VICTOZA 18 MG/3ML Sopn Generic drug:  liraglutide     TAKE these medications   albuterol 108 (90 Base) MCG/ACT inhaler Commonly known as:  PROVENTIL HFA;VENTOLIN HFA Inhale 2 puffs into the lungs every 6 (six) hours as needed for wheezing or shortness of breath.   amLODipine 10 MG tablet Commonly known as:  NORVASC Take 1 tablet (10 mg total) by mouth daily. Start taking on:  08/26/2017   ARIPiprazole 5 MG tablet Commonly known as:  ABILIFY Take 2.5 mg by mouth daily.   aspirin 81 MG tablet Take 81 mg by mouth daily.   atorvastatin 80 MG tablet Commonly known as:  LIPITOR Take 80 mg by mouth daily at 6 PM.   clonazePAM 0.5 MG tablet Commonly known as:  KLONOPIN Take 0.5 mg by mouth 3 (three) times daily as needed for anxiety.   clopidogrel 75 MG tablet Commonly known as:  PLAVIX Take 1 tablet (75 mg total) by mouth daily. Start taking on:  08/26/2017   gabapentin 300 MG capsule Commonly known as:  NEURONTIN Take 1 capsule by mouth daily.   Garcinia Cambogia-Chromium 500-200 MG-MCG Tabs Take 1 tablet by mouth 2 (two) times daily.   hydrochlorothiazide 25 MG tablet Commonly known as:  HYDRODIURIL Take 25 mg by mouth daily.   insulin detemir 100 UNIT/ML injection Commonly known as:   LEVEMIR Inject 46 Units into the skin at bedtime.   meclizine 12.5 MG tablet Commonly known as:  ANTIVERT Take 1 tablet (12.5 mg total) by mouth 3 (three) times daily.   Melatonin 10 MG Caps Take 10 mg by mouth at bedtime.   metFORMIN 1000 MG (MOD) 24 hr tablet Commonly known as:  GLUMETZA Take 1,000 mg by mouth 2 (two) times daily with a meal.   metoprolol succinate 25 MG 24 hr tablet Commonly known as:  TOPROL-XL Take 25 mg by mouth daily. Take with or immediately following a meal.   nitroGLYCERIN 0.4 MG SL tablet Commonly known as:  NITROSTAT Place 0.4 mg under the tongue every 5 (five) minutes as needed for chest pain.   nystatin-triamcinolone cream Commonly known as:  MYCOLOG II Apply to affected area daily   Olopatadine HCl 0.2 % Soln 1 drop to affected eye daily.   QUEtiapine 25 MG tablet Commonly known as:  SEROQUEL Take 25 mg by mouth at bedtime.        DISCHARGE INSTRUCTIONS:    Follow with Neurologist in 1-2 weeks.  If you experience worsening of your admission symptoms, develop shortness of breath, life threatening emergency, suicidal or homicidal thoughts you must seek medical attention immediately by calling 911 or calling your MD immediately  if symptoms less severe.  You Must read complete instructions/literature along with all the possible adverse reactions/side effects for all the Medicines you take and that have been prescribed to you. Take any new Medicines after you have completely understood and accept all the possible adverse reactions/side effects.   Please note  You were cared for by a hospitalist during your hospital stay. If you have any questions about your discharge medications or the care you received while you were in the hospital after you are discharged, you can call the unit and asked to speak with the hospitalist on call if the hospitalist that took care of you is not available. Once you are discharged, your primary care physician  will handle any further medical issues. Please note that NO REFILLS for any discharge medications will be authorized once you are discharged, as it is imperative that you return to your primary care physician (or establish a relationship with a primary care physician if you do not have one) for your aftercare needs so that they can reassess your need for medications and monitor your lab values.    Today   CHIEF COMPLAINT:   Chief Complaint  Patient presents with  . Nausea  . Emesis  . Weakness    HISTORY OF PRESENT ILLNESS:  Haley Mooney  is a 77 y.o. female with a known history of GERD, hypertension, diabetes mellitus type 2, skin cancer, coronary artery disease presented to the emergency room for nausea vomiting and  dizziness.  Patient has dizziness going on for the last 2 weeks and she went in and got evaluated at [facility]  She was discharged.  Patient still continued to have some dizziness and vomitings.  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  No complains of any slurred speech.  No weakness in any part of the body.  No tingling or numbness in any part of the body.  Hospitalist service was consulted for further care.  VITAL SIGNS:  Blood pressure (!) 156/86, pulse (!) 101, temperature 98.5 F (36.9 C), temperature source Oral, resp. rate 18, height 5\' 4"  (1.626 m), weight 107.5 kg, SpO2 97 %.  I/O:    Intake/Output Summary (Last 24 hours) at 08/25/2017 1310 Last data filed at 08/25/2017 1012 Gross per 24 hour  Intake 360 ml  Output -  Net 360 ml    PHYSICAL EXAMINATION:   GENERAL:  77 y.o.-year-old patient lying in the bed with no acute distress.  EYES: Pupils equal, round, reactive to light and accommodation. No scleral icterus. Extraocular muscles intact.  HEENT: Head atraumatic, normocephalic. Oropharynx and nasopharynx clear.  NECK:  Supple, no jugular venous distention. No thyroid  enlargement, no tenderness.  LUNGS: Normal breath sounds bilaterally, no wheezing, rales, rhonchi. No use of accessory muscles of respiration.  CARDIOVASCULAR: S1, S2 normal. No murmurs, rubs, or gallops.  ABDOMEN: Soft, nontender, nondistended. Bowel sounds present. No organomegaly or mass.  EXTREMITIES: No cyanosis, clubbing or edema b/l.    NEUROLOGIC: Cranial nerves II through XII are intact. No focal Motor or sensory deficits b/l.   PSYCHIATRIC: The patient is alert and oriented x 3.  SKIN: No obvious rash, lesion, or ulcer.   DATA REVIEW:   CBC Recent Labs  Lab 08/21/17 1628  WBC 13.6*  HGB 12.5  HCT 37.6  PLT 219    Chemistries  Recent Labs  Lab 08/21/17 1628  NA 138  K 3.7  CL 106  CO2 22  GLUCOSE 305*  BUN 21  CREATININE 0.50  CALCIUM 8.8*  AST 29  ALT 27  ALKPHOS 102  BILITOT 0.5    Cardiac Enzymes Recent Labs  Lab 08/21/17 1628  TROPONINI <0.03    Microbiology Results  No results found for this or any previous visit.  RADIOLOGY:  No results found.  EKG:   Orders placed or performed during the hospital encounter of 08/21/17  . ED EKG  . ED EKG  . EKG 12-Lead  . EKG 12-Lead      Management plans discussed with the patient, family and they are in agreement.  CODE STATUS:     Code Status Orders  (From admission, onward)         Start     Ordered   08/22/17 0511  Do not attempt resuscitation (DNR)  Continuous    Question Answer Comment  In the event of cardiac or respiratory ARREST Do not call a "code blue"   In the event of cardiac or respiratory ARREST Do not perform Intubation, CPR, defibrillation or ACLS   In the event of cardiac or respiratory ARREST Use medication by any route, position, wound care, and other measures to relive pain and suffering. May use oxygen, suction and manual treatment of airway obstruction as needed for comfort.      08/22/17 0510        Code Status History    Date Active Date  Inactive Code Status  Order ID Comments User Context   08/21/2017 2153 08/22/2017 0510 Full Code [id number]  Saundra Shelling, MD Inpatient    Advance Directive Documentation     Most Recent Value  Type of Advance Directive  Healthcare Power of [place], Living will, Out of facility DNR (pink MOST or yellow form)  Pre-existing out of facility DNR order (yellow form or pink MOST form)  Yellow form placed in chart (order not valid for inpatient use)  "MOST" Form in Place?  -      TOTAL TIME TAKING CARE OF THIS PATIENT: 35 minutes.    Vaughan Basta M.D on 08/25/2017 at 1:10 PM  Between 7am to 6pm - Pager - [phone]  After 6pm go to [email or web address] - password [email or web address] [facility]  Office  [phone]  CC: Primary care physician; Esmond Harps, MD   Note: This dictation was prepared with Dragon dictation along with smaller phrase technology. Any transcriptional errors that result from this process are unintentional.

## 2017-08-25 NOTE — Care Management Important Message (Signed)
Important Message  Patient Details  Name: Haley Mooney MRN: [id number] Date of Birth: 1940/01/16   Medicare Important Message Given:  Yes    Juliann Pulse A Caeli Linehan 08/25/2017, 11:18 AM

## 2018-03-13 ENCOUNTER — Ambulatory Visit: Payer: Medicare Other | Attending: Neurology

## 2018-03-13 DIAGNOSIS — G4719 Other hypersomnia: Secondary | ICD-10-CM | POA: Insufficient documentation

## 2018-09-01 ENCOUNTER — Other Ambulatory Visit: Payer: Self-pay | Admitting: Neurology

## 2018-09-01 ENCOUNTER — Other Ambulatory Visit (HOSPITAL_COMMUNITY): Payer: Self-pay | Admitting: Neurology

## 2018-09-01 DIAGNOSIS — R2689 Other abnormalities of gait and mobility: Secondary | ICD-10-CM

## 2018-09-08 ENCOUNTER — Encounter (HOSPITAL_COMMUNITY): Payer: Self-pay

## 2018-09-08 ENCOUNTER — Other Ambulatory Visit (HOSPITAL_COMMUNITY): Payer: Medicare Other

## 2018-09-11 ENCOUNTER — Other Ambulatory Visit: Admission: RE | Admit: 2018-09-11 | Payer: Medicare Other | Source: Ambulatory Visit

## 2018-10-15 ENCOUNTER — Other Ambulatory Visit
Admission: RE | Admit: 2018-10-15 | Discharge: 2018-10-15 | Disposition: A | Discharge status: Home / Self Care | Payer: Medicare Other | Source: Ambulatory Visit | Attending: Neurology | Admitting: Neurology

## 2018-10-15 ENCOUNTER — Other Ambulatory Visit: Payer: Self-pay

## 2018-10-15 DIAGNOSIS — Z20828 Contact with and (suspected) exposure to other viral communicable diseases: Secondary | ICD-10-CM | POA: Diagnosis not present

## 2018-10-15 DIAGNOSIS — Z01812 Encounter for preprocedural laboratory examination: Secondary | ICD-10-CM | POA: Diagnosis present

## 2018-10-15 LAB — SARS CORONAVIRUS 2 (TAT 6-24 HRS): SARS Coronavirus 2: NEGATIVE

## 2018-10-19 ENCOUNTER — Ambulatory Visit: Payer: Medicare Other | Attending: Neurology

## 2018-10-19 DIAGNOSIS — G4733 Obstructive sleep apnea (adult) (pediatric): Secondary | ICD-10-CM | POA: Insufficient documentation

## 2018-10-19 DIAGNOSIS — G4761 Periodic limb movement disorder: Secondary | ICD-10-CM | POA: Diagnosis not present

## 2018-10-20 ENCOUNTER — Other Ambulatory Visit: Payer: Self-pay

## 2019-02-16 IMAGING — CR DG CHEST 2V
2 series · 2 of 2 positions shown · non-contrast
Comparison: None.

CLINICAL DATA: Cough

EXAM:
CHEST  2 VIEW

[chest pa]
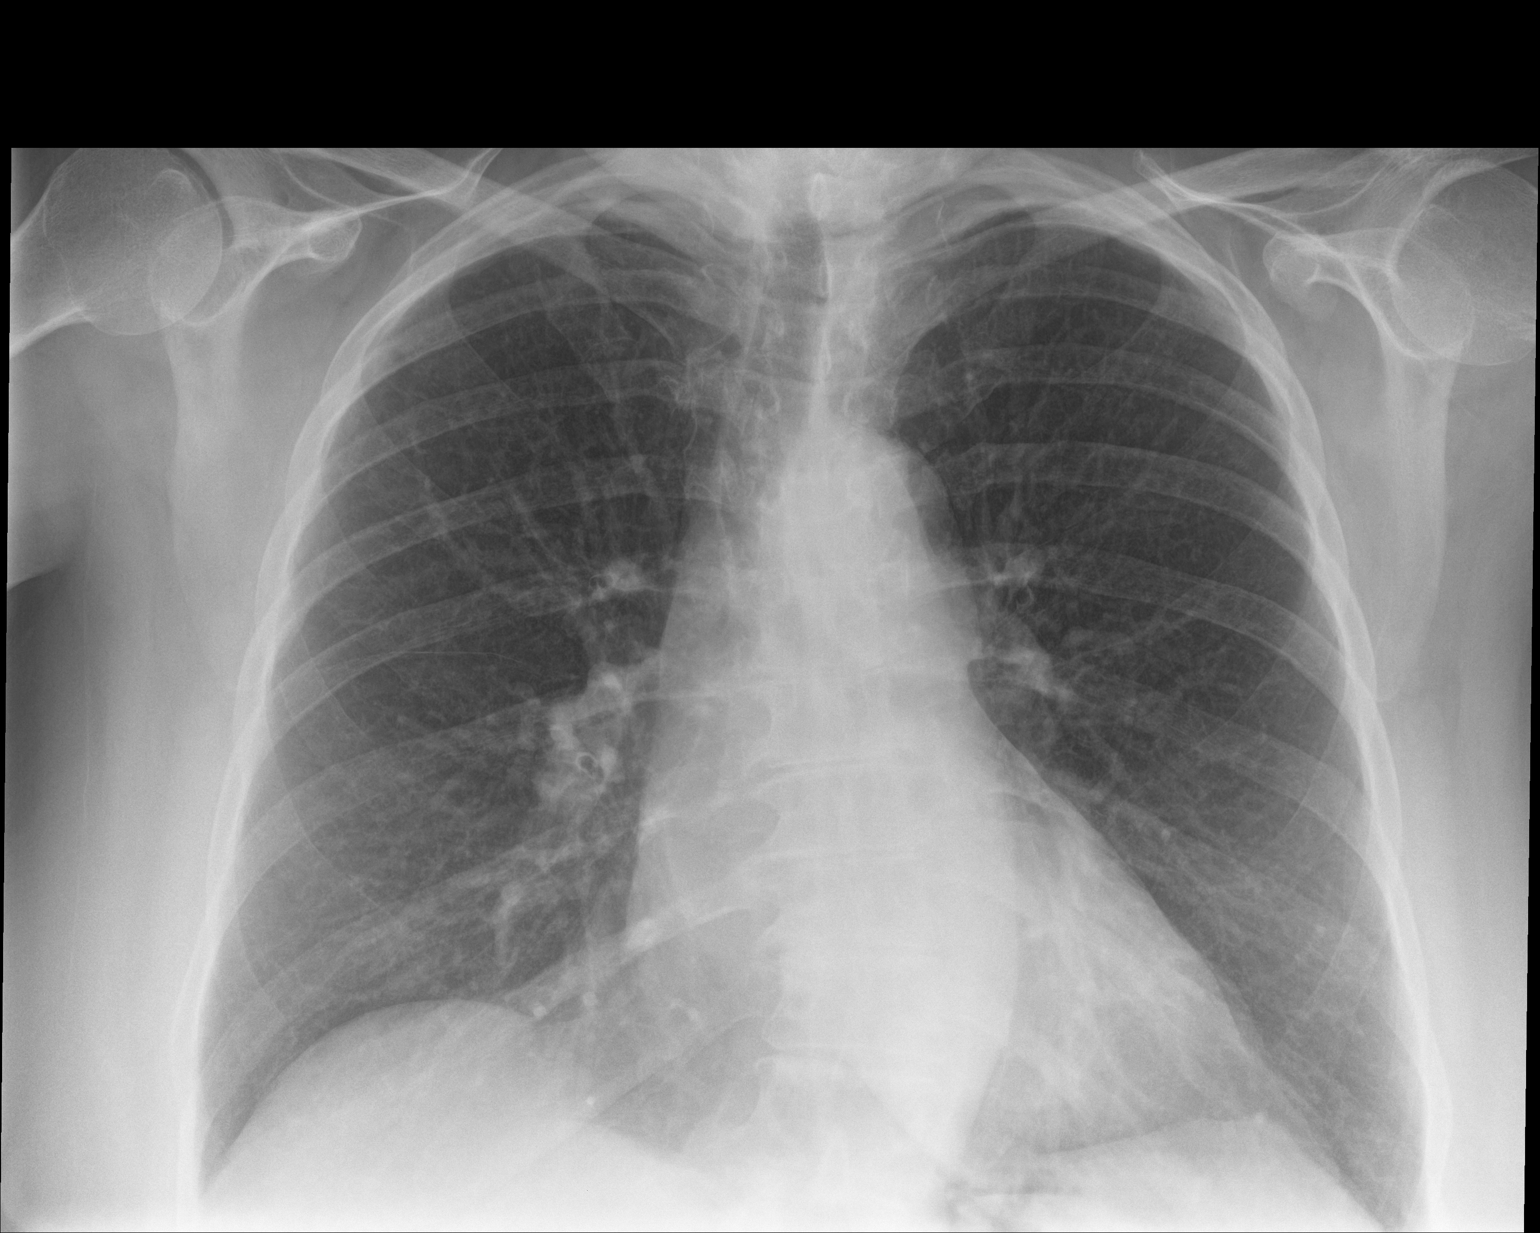

[chest lat]
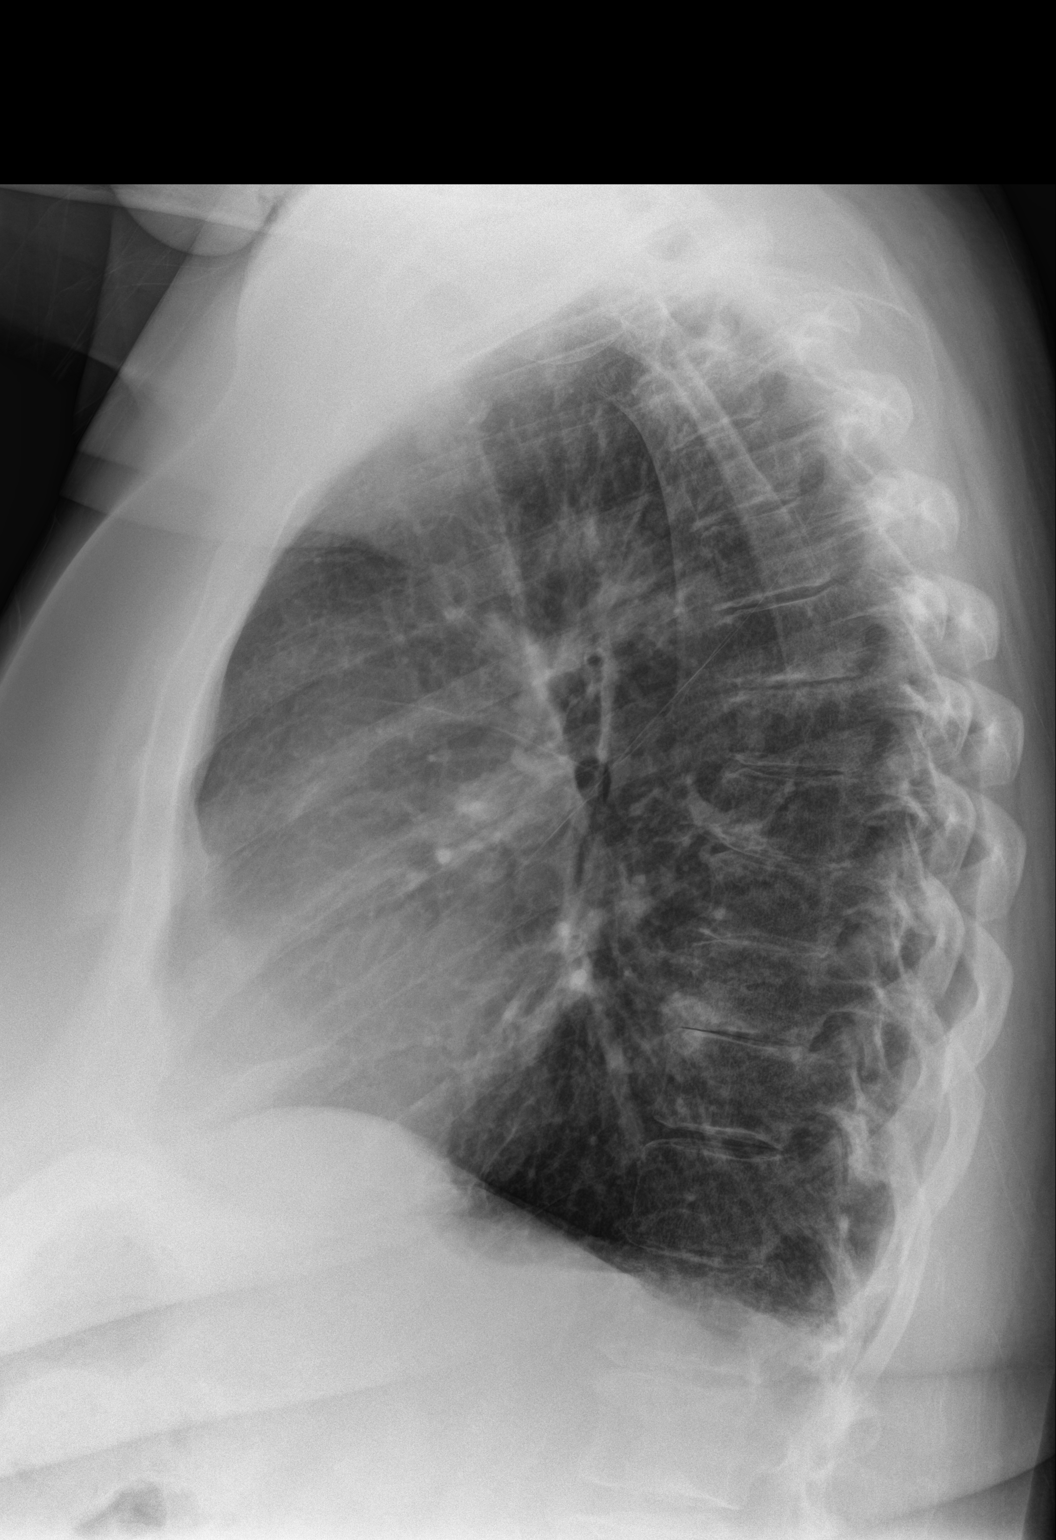

[2 of 2 positions shown; findings below may reference images not displayed]

FINDINGS: Heart and mediastinal contours are within normal limits. No focal
opacities or effusions. No acute bony abnormality.
IMPRESSION: No active cardiopulmonary disease.

## 2019-05-31 ENCOUNTER — Emergency Department: Payer: Medicare Other

## 2019-05-31 ENCOUNTER — Encounter: Payer: Self-pay | Admitting: Emergency Medicine

## 2019-05-31 ENCOUNTER — Emergency Department
Admission: EM | Admit: 2019-05-31 | Discharge: 2019-05-31 | Disposition: A | Discharge status: Home / Self Care | Payer: Medicare Other | Attending: Emergency Medicine | Admitting: Emergency Medicine

## 2019-05-31 ENCOUNTER — Other Ambulatory Visit: Payer: Self-pay

## 2019-05-31 ENCOUNTER — Ambulatory Visit (INDEPENDENT_AMBULATORY_CARE_PROVIDER_SITE_OTHER)
Admission: EM | Admit: 2019-05-31 | Discharge: 2019-05-31 | Disposition: A | Discharge status: Other Acute Inpatient Hospital | Payer: Medicare Other | Source: Home / Self Care | Attending: Family Medicine | Admitting: Family Medicine

## 2019-05-31 DIAGNOSIS — R410 Disorientation, unspecified: Secondary | ICD-10-CM | POA: Insufficient documentation

## 2019-05-31 DIAGNOSIS — E119 Type 2 diabetes mellitus without complications: Secondary | ICD-10-CM | POA: Diagnosis not present

## 2019-05-31 DIAGNOSIS — Z79899 Other long term (current) drug therapy: Secondary | ICD-10-CM | POA: Insufficient documentation

## 2019-05-31 DIAGNOSIS — Z7902 Long term (current) use of antithrombotics/antiplatelets: Secondary | ICD-10-CM | POA: Insufficient documentation

## 2019-05-31 DIAGNOSIS — Z955 Presence of coronary angioplasty implant and graft: Secondary | ICD-10-CM | POA: Diagnosis not present

## 2019-05-31 DIAGNOSIS — I2510 Atherosclerotic heart disease of native coronary artery without angina pectoris: Secondary | ICD-10-CM | POA: Diagnosis not present

## 2019-05-31 DIAGNOSIS — R2681 Unsteadiness on feet: Secondary | ICD-10-CM

## 2019-05-31 DIAGNOSIS — Z794 Long term (current) use of insulin: Secondary | ICD-10-CM | POA: Diagnosis not present

## 2019-05-31 DIAGNOSIS — R4182 Altered mental status, unspecified: Secondary | ICD-10-CM | POA: Diagnosis present

## 2019-05-31 DIAGNOSIS — F17290 Nicotine dependence, other tobacco product, uncomplicated: Secondary | ICD-10-CM | POA: Diagnosis not present

## 2019-05-31 DIAGNOSIS — Z7982 Long term (current) use of aspirin: Secondary | ICD-10-CM | POA: Insufficient documentation

## 2019-05-31 DIAGNOSIS — Z85828 Personal history of other malignant neoplasm of skin: Secondary | ICD-10-CM | POA: Diagnosis not present

## 2019-05-31 DIAGNOSIS — Z9660 Presence of unspecified orthopedic joint implant: Secondary | ICD-10-CM | POA: Insufficient documentation

## 2019-05-31 DIAGNOSIS — I10 Essential (primary) hypertension: Secondary | ICD-10-CM | POA: Insufficient documentation

## 2019-05-31 DIAGNOSIS — N390 Urinary tract infection, site not specified: Secondary | ICD-10-CM | POA: Diagnosis not present

## 2019-05-31 HISTORY — DX: Cerebral infarction, unspecified: I63.9

## 2019-05-31 LAB — GLUCOSE, CAPILLARY: Glucose-Capillary: 131 mg/dL — ABNORMAL HIGH (ref 70–99)

## 2019-05-31 LAB — URINALYSIS, COMPLETE (UACMP) WITH MICROSCOPIC
Bilirubin Urine: NEGATIVE
Glucose, UA: >=500 mg/dL — AB
Hgb urine dipstick: NEGATIVE
Ketones, ur: 5 mg/dL — AB
Nitrite: NEGATIVE
Protein, ur: NEGATIVE mg/dL
Specific Gravity, Urine: 1.029 (ref 1.005–1.030)
WBC, UA: >50 WBC/hpf — ABNORMAL HIGH (ref 0–5)
pH: 7 (ref 5.0–8.0)

## 2019-05-31 LAB — DIFFERENTIAL
Abs Immature Granulocytes: 0.05 10*3/uL (ref 0.00–0.07)
Basophils Absolute: 0.1 10*3/uL (ref 0.0–0.1)
Basophils Relative: 1 %
Eosinophils Absolute: 0.2 10*3/uL (ref 0.0–0.5)
Eosinophils Relative: 1 %
Immature Granulocytes: 0 %
Lymphocytes Relative: 32 %
Lymphs Abs: 4.2 10*3/uL — ABNORMAL HIGH (ref 0.7–4.0)
Monocytes Absolute: 0.5 10*3/uL (ref 0.1–1.0)
Monocytes Relative: 4 %
Neutro Abs: 8.2 10*3/uL — ABNORMAL HIGH (ref 1.7–7.7)
Neutrophils Relative %: 62 %

## 2019-05-31 LAB — COMPREHENSIVE METABOLIC PANEL
ALT: 19 U/L (ref 0–44)
AST: 18 U/L (ref 15–41)
Albumin: 4.1 g/dL (ref 3.5–5.0)
Alkaline Phosphatase: 105 U/L (ref 38–126)
Anion gap: 11 (ref 5–15)
BUN: 15 mg/dL (ref 8–23)
CO2: 21 mmol/L — ABNORMAL LOW (ref 22–32)
Calcium: 9.5 mg/dL (ref 8.9–10.3)
Chloride: 105 mmol/L (ref 98–111)
Creatinine, Ser: 0.55 mg/dL (ref 0.44–1.00)
GFR calc Af Amer: >60 mL/min (ref >60)
GFR calc non Af Amer: >60 mL/min (ref >60)
Glucose, Bld: 119 mg/dL — ABNORMAL HIGH (ref 70–99)
Potassium: 3.8 mmol/L (ref 3.5–5.1)
Sodium: 137 mmol/L (ref 135–145)
Total Bilirubin: 0.8 mg/dL (ref 0.3–1.2)
Total Protein: 7.2 g/dL (ref 6.5–8.1)

## 2019-05-31 LAB — CBC
HCT: 39.4 % (ref 36.0–46.0)
Hemoglobin: 13.1 g/dL (ref 12.0–15.0)
MCH: 27.3 pg (ref 26.0–34.0)
MCHC: 33.2 g/dL (ref 30.0–36.0)
MCV: 82.1 fL (ref 80.0–100.0)
Platelets: 237 10*3/uL (ref 150–400)
RBC: 4.8 MIL/uL (ref 3.87–5.11)
RDW: 13.7 % (ref 11.5–15.5)
WBC: 13.2 10*3/uL — ABNORMAL HIGH (ref 4.0–10.5)
nRBC: 0 % (ref 0.0–0.2)

## 2019-05-31 LAB — APTT: aPTT: 27 seconds (ref 24–36)

## 2019-05-31 LAB — PROTIME-INR
INR: 1 (ref 0.8–1.2)
Prothrombin Time: 12.7 seconds (ref 11.4–15.2)

## 2019-05-31 MED ORDER — SODIUM CHLORIDE 0.9 % IV SOLN
1.0000 g | Freq: Once | INTRAVENOUS | Status: AC
  Administered 2019-05-31: 1 g via INTRAVENOUS
  Filled 2019-05-31: qty 10

## 2019-05-31 MED ORDER — CEFPODOXIME PROXETIL 200 MG PO TABS: 200.0000 mg | ORAL_TABLET | Freq: Two times a day (BID) | ORAL | 0 refills | Status: AC

## 2019-05-31 MED ORDER — LACTATED RINGERS IV BOLUS
1000.0000 mL | Freq: Once | INTRAVENOUS | Status: AC
  Administered 2019-05-31: 1000 mL via INTRAVENOUS

## 2019-05-31 NOTE — ED Triage Notes (Signed)
Pt comes into the ED via EMS from [facility] urgent care and is having confusion and HA since 830am, was normal when she went to bed last night

## 2019-05-31 NOTE — ED Provider Notes (Signed)
[facility] Emergency Department Provider Note   ____________________________________________   First MD Initiated Contact with Patient 05/31/19 1803     (approximate)  I have reviewed the triage vital signs and the nursing notes.   HISTORY  Chief Complaint Altered Mental Status and Weakness    HPI Haley Mooney is a 79 y.o. female with past medical history of CAD, stroke, hypertension, diabetes who presents to the ED for confusion.  Patient reports that ever since she woke up this morning she has felt confused and disoriented.  She was attempting to go to the mall but ended up driving [place] on the highway rather than [place], eventually getting lost.  She has felt lightheaded at times, but denies any vision changes, speech changes, numbness, or weakness.  She was concerned she could be having a stroke or TIA, initially presented to an urgent care, where she was referred to the ED for further evaluation.  She feels slightly better than she did earlier today, but still feels somewhat disoriented.  She does state that she was treated for a UTI about 2 weeks ago by her PCP, has had recurrence of dysuria and urinary urgency over the past couple of days.  She denies any fevers, abdominal pain, nausea, or vomiting.        Past Medical History:  Diagnosis Date  . Anxiety   . Arthritis   . Bipolar disorder ([place])   . CAD (coronary artery disease)   . Cancer ([place])    SKIN  . Depression   . Diabetes mellitus without complication ([place])   . GERD (gastroesophageal reflux disease)    H/O  . Heart murmur   . Hypertension   . Neuromuscular disorder ([place])    NEUROPATHY  . Pain    CHEST OCC WITH EXERTION  . Pneumonia    IN PAST  . Shortness of breath dyspnea   . Stroke [facility])     Patient Active Problem List   Diagnosis Date Noted  . CVA (cerebral vascular accident) ([place]) 08/21/2017    Past Surgical History:  Procedure Laterality Date  . ABDOMINAL HYSTERECTOMY     . APPENDECTOMY    . BACK SURGERY    . BLADDER SUSPENSION    . CATARACT EXTRACTION Left   . CATARACT EXTRACTION W/PHACO Left 08/08/2015   Procedure: CATARACT EXTRACTION PHACO AND INTRAOCULAR LENS PLACEMENT (IOC);  Surgeon: Birder Robson, MD;  Location: [facility] ORS;  Service: Ophthalmology;  Laterality: Left;  [place] 00:55 AP% 17.1 CDE 9.48 fluid pack lot # [id number]  . CATARACT EXTRACTION W/PHACO Right 09/05/2015   Procedure: CATARACT EXTRACTION PHACO AND INTRAOCULAR LENS PLACEMENT (IOC);  Surgeon: Birder Robson, MD;  Location: [facility] ORS;  Service: Ophthalmology;  Laterality: Right;  Lot# [id number] H US:00:43.0 AP%: 20.3 CDE: 8.74  . COLONOSCOPY    . CORONARY ANGIOPLASTY WITH STENT PLACEMENT     CATH DONE [facility] 6/17 DR PAULA MILLER  . EYE SURGERY    . JOINT REPLACEMENT    . TONSILLECTOMY      Prior to Admission medications   Medication Sig Start Date End Date Taking? Authorizing Provider  albuterol (PROVENTIL HFA;VENTOLIN HFA) 108 (90 Base) MCG/ACT inhaler Inhale 2 puffs into the lungs every 6 (six) hours as needed for wheezing or shortness of breath.    [provider]  amLODipine (NORVASC) 10 MG tablet Take 1 tablet (10 mg total) by mouth daily. 08/26/17   Vaughan Basta, MD  ARIPiprazole (ABILIFY) 5 MG tablet Take 2.5 mg  by mouth daily.     [provider]  aspirin 81 MG tablet Take 81 mg by mouth daily.    [provider]  atorvastatin (LIPITOR) 80 MG tablet Take 80 mg by mouth daily at 6 PM.    [provider]  cefpodoxime (VANTIN) 200 MG tablet Take 1 tablet (200 mg total) by mouth 2 (two) times daily for 7 days. 05/31/19 06/07/19  Blake Divine, MD  clonazePAM (KLONOPIN) 0.5 MG tablet Take 0.5 mg by mouth 3 (three) times daily as needed for anxiety.    [provider]  clopidogrel (PLAVIX) 75 MG tablet Take 1 tablet (75 mg total) by mouth daily. 08/26/17   Vaughan Basta, MD  gabapentin (NEURONTIN) 300 MG capsule Take 1 capsule  by mouth daily. 07/15/17   [provider]  Garcinia Cambogia-Chromium 500-200 MG-MCG TABS Take 1 tablet by mouth 2 (two) times daily.    [provider]  hydrochlorothiazide (HYDRODIURIL) 25 MG tablet Take 25 mg by mouth daily.    [provider]  insulin detemir (LEVEMIR) 100 UNIT/ML injection Inject 46 Units into the skin at bedtime.     [provider]  meclizine (ANTIVERT) 12.5 MG tablet Take 1 tablet (12.5 mg total) by mouth 3 (three) times daily. 08/25/17   Vaughan Basta, MD  Melatonin 10 MG CAPS Take 10 mg by mouth at bedtime.    [provider]  metFORMIN (GLUMETZA) 1000 MG (MOD) 24 hr tablet Take 1,000 mg by mouth 2 (two) times daily with a meal.     [provider]  metoprolol succinate (TOPROL-XL) 25 MG 24 hr tablet Take 25 mg by mouth daily. Take with or immediately following a meal.     [provider]  nitroGLYCERIN (NITROSTAT) 0.4 MG SL tablet Place 0.4 mg under the tongue every 5 (five) minutes as needed for chest pain.    [provider]  nystatin-triamcinolone (MYCOLOG II) cream Apply to affected area daily 12/23/15   Marney Setting, NP  Olopatadine HCl 0.2 % SOLN 1 drop to affected eye daily. 05/26/16   Coral Spikes, DO  QUEtiapine (SEROQUEL) 25 MG tablet Take 25 mg by mouth at bedtime.     [provider]    Allergies Ace inhibitors and Tetracyclines & related  Family History  Problem Relation Age of Onset  . Heart failure Father     Social History Social History   Tobacco Use  . Smoking status: Former Smoker    Types: E-cigarettes  . Smokeless tobacco: Current User  Substance Use Topics  . Alcohol use: Yes    Comment: OCCAS  . Drug use: No    Review of Systems  Constitutional: No fever/chills.  Positive for lightheadedness and confusion. Eyes: No visual changes. ENT: No sore throat. Cardiovascular: Denies chest pain. Respiratory: Denies shortness of  breath. Gastrointestinal: No abdominal pain.  No nausea, no vomiting.  No diarrhea.  No constipation. Genitourinary: Positive for dysuria and urgency. Musculoskeletal: Negative for back pain. Skin: Negative for rash. Neurological: Negative for headaches, focal weakness or numbness.  ____________________________________________   PHYSICAL EXAM:  VITAL SIGNS: ED Triage Vitals  Enc Vitals Group     BP 05/31/19 1402 106/61     Pulse Rate 05/31/19 1402 65     Resp 05/31/19 1402 18     Temp 05/31/19 1402 97.6 F (36.4 C)     Temp Source 05/31/19 1402 Oral     SpO2 05/31/19 1402 99 %  Weight 05/31/19 1406 220 lb (99.8 kg)     Height 05/31/19 1406 5\' 6"  (1.676 m)     Head Circumference --      Peak Flow --      Pain Score 05/31/19 1406 5     Pain Loc --      Pain Edu? --      Excl. in [place]? --     Constitutional: Alert and oriented. Eyes: Conjunctivae are normal. Head: Atraumatic. Nose: No congestion/rhinnorhea. Mouth/Throat: Mucous membranes are moist. Neck: Normal ROM Cardiovascular: Normal rate, regular rhythm. Grossly normal heart sounds. Respiratory: Normal respiratory effort.  No retractions. Lungs CTAB. Gastrointestinal: Soft and nontender. No distention. Genitourinary: deferred Musculoskeletal: No lower extremity tenderness nor edema. Neurologic:  Normal speech and language. No gross focal neurologic deficits are appreciated. Skin:  Skin is warm, dry and intact. No rash noted. Psychiatric: Mood and affect are normal. Speech and behavior are normal.  ____________________________________________   LABS (all labs ordered are listed, but only abnormal results are displayed)  Labs Reviewed  CBC - Abnormal; Notable for the following components:      Result Value   WBC 13.2 (*)    All other components within normal limits  DIFFERENTIAL - Abnormal; Notable for the following components:   Neutro Abs 8.2 (*)    Lymphs Abs 4.2 (*)    All other components within  normal limits  COMPREHENSIVE METABOLIC PANEL - Abnormal; Notable for the following components:   CO2 21 (*)    Glucose, Bld 119 (*)    All other components within normal limits  URINALYSIS, COMPLETE (UACMP) WITH MICROSCOPIC - Abnormal; Notable for the following components:   Color, Urine YELLOW (*)    APPearance HAZY (*)    Glucose, UA >=500 (*)    Ketones, ur 5 (*)    Leukocytes,Ua SMALL (*)    WBC, UA >50 (*)    Bacteria, UA MANY (*)    All other components within normal limits  URINE CULTURE  PROTIME-INR  APTT  I-STAT CREATININE, ED  CBG MONITORING, ED   ____________________________________________  EKG  ED ECG REPORT I, Blake Divine, the attending physician, personally viewed and interpreted this ECG.   Date: 05/31/2019  EKG Time: 14:04  Rate: 65  Rhythm: normal EKG, normal sinus rhythm, unchanged from previous tracings  Axis: Normal  Intervals:none  ST&T Change: None   PROCEDURES  Procedure(s) performed (including Critical Care):  Procedures   ____________________________________________   INITIAL IMPRESSION / ASSESSMENT AND PLAN / ED COURSE       79 year old female with history of CAD, stroke, hypertension, and diabetes presents to the ED with lightheadedness and confusion starting earlier this morning, additionally endorses return of dysuria and urinary urgency over the past couple of days.  She has no focal neurologic deficits on exam and is alert and oriented x4.  What she describes occurring earlier does not sound consistent with TIA as she had no focal symptoms, vision changes, or speech changes.  Lab work is reassuring, UA does appear infected and UTI seems to be the likely cause of her symptoms.  We will hydrate with IV fluids and give initial dose of Rocephin, after which she will be appropriate for discharge home.  I have spoken with her daughter and son-in-law over the phone, who will have patient stay with them over the next couple of days and have  her follow-up closely with her PCP.  They were counseled to have her return to the ED  for new or worsening symptoms, patient and family agree with plan.      ____________________________________________   FINAL CLINICAL IMPRESSION(S) / ED DIAGNOSES  Final diagnoses:  Lower urinary tract infectious disease  Disorientation     ED Discharge Orders         Ordered    cefpodoxime (VANTIN) 200 MG tablet  2 times daily     05/31/19 1940           Note:  This document was prepared using Dragon voice recognition software and may include unintentional dictation errors.   Blake Divine, MD 05/31/19 [phone]

## 2019-05-31 NOTE — ED Triage Notes (Signed)
Patient presents to the ED with confusion and increased weakness since waking up this morning as well as headache that started on arrival to ED.  Patient states [place] was headed on the highway toward [place].  Patient has history of 2 strokes in the past.  Patient denies any unilateral weakness, no facial droop or difficulty speaking noted at this time.

## 2019-05-31 NOTE — ED Provider Notes (Addendum)
[facility] ____________________________________________  Time seen: Approximately 12:43 PM  I have reviewed the triage vital signs and the nursing notes.   HISTORY  Chief Complaint Altered Mental Status  HPI Haley Mooney is a 79 y.o. female past medical history of diabetes, CAD, hypertension previous CVA presenting for acute confusion and unsteady gait.  Patient reports that she felt normal last night going to bed.  Reports upon awakening at 830 this morning she has felt confused and foggy headed.  Reports that she even left her house to go take care of a bill and went the wrong direction which is very atypical for her.  States she is having a hard time focusing and processing.  Also reports she feels unsteady walking and some generalized blurred vision.  Denies any acute vision field loss, unilateral vision loss or change, paresthesia, unilateral weakness, injury, syncope, chest pain.  Denies headache.  Reports some mild shortness of breath, in which she reports she is also feeling anxious at this time.  States that her blood sugar was 170s first thing this morning and she took her medications as normal and did have a Glucerna.  Denies recent sickness or illness.  Currently on Plavix.   Esmond Harps, MD : PCP   Past Medical History:  Diagnosis Date  . Anxiety   . Arthritis   . Bipolar disorder ([place])   . CAD (coronary artery disease)   . Cancer ([place])    SKIN  . Depression   . Diabetes mellitus without complication ([place])   . GERD (gastroesophageal reflux disease)    H/O  . Heart murmur   . Hypertension   . Neuromuscular disorder ([place])    NEUROPATHY  . Pain    CHEST OCC WITH EXERTION  . Pneumonia    IN PAST  . Shortness of breath dyspnea     Patient Active Problem List   Diagnosis Date Noted  . CVA (cerebral vascular accident) ([place]) 08/21/2017    Past Surgical History:  Procedure Laterality Date  . ABDOMINAL HYSTERECTOMY    . APPENDECTOMY    . BACK  SURGERY    . BLADDER SUSPENSION    . CATARACT EXTRACTION Left   . CATARACT EXTRACTION W/PHACO Left 08/08/2015   Procedure: CATARACT EXTRACTION PHACO AND INTRAOCULAR LENS PLACEMENT (IOC);  Surgeon: Birder Robson, MD;  Location: [facility] ORS;  Service: Ophthalmology;  Laterality: Left;  [place] 00:55 AP% 17.1 CDE 9.48 fluid pack lot # [id number]  . CATARACT EXTRACTION W/PHACO Right 09/05/2015   Procedure: CATARACT EXTRACTION PHACO AND INTRAOCULAR LENS PLACEMENT (IOC);  Surgeon: Birder Robson, MD;  Location: [facility] ORS;  Service: Ophthalmology;  Laterality: Right;  Lot# [id number] H US:00:43.0 AP%: 20.3 CDE: 8.74  . COLONOSCOPY    . CORONARY ANGIOPLASTY WITH STENT PLACEMENT     CATH DONE [facility] 6/17 DR PAULA MILLER  . EYE SURGERY    . JOINT REPLACEMENT    . TONSILLECTOMY       No current facility-administered medications for this encounter.  Current Outpatient Medications:  .  albuterol (PROVENTIL HFA;VENTOLIN HFA) 108 (90 Base) MCG/ACT inhaler, Inhale 2 puffs into the lungs every 6 (six) hours as needed for wheezing or shortness of breath., Disp: , Rfl:  .  amLODipine (NORVASC) 10 MG tablet, Take 1 tablet (10 mg total) by mouth daily., Disp: 30 tablet, Rfl: 0 .  ARIPiprazole (ABILIFY) 5 MG tablet, Take 2.5 mg by mouth daily. , Disp: , Rfl:  .  aspirin 81 MG tablet, Take 81  mg by mouth daily., Disp: , Rfl:  .  atorvastatin (LIPITOR) 80 MG tablet, Take 80 mg by mouth daily at 6 PM., Disp: , Rfl:  .  clonazePAM (KLONOPIN) 0.5 MG tablet, Take 0.5 mg by mouth 3 (three) times daily as needed for anxiety., Disp: , Rfl:  .  clopidogrel (PLAVIX) 75 MG tablet, Take 1 tablet (75 mg total) by mouth daily., Disp: 30 tablet, Rfl: 0 .  gabapentin (NEURONTIN) 300 MG capsule, Take 1 capsule by mouth daily., Disp: , Rfl:  .  Garcinia Cambogia-Chromium 500-200 MG-MCG TABS, Take 1 tablet by mouth 2 (two) times daily., Disp: , Rfl:  .  hydrochlorothiazide (HYDRODIURIL) 25 MG tablet, Take 25 mg by mouth daily., Disp: ,  Rfl:  .  insulin detemir (LEVEMIR) 100 UNIT/ML injection, Inject 46 Units into the skin at bedtime. , Disp: , Rfl:  .  meclizine (ANTIVERT) 12.5 MG tablet, Take 1 tablet (12.5 mg total) by mouth 3 (three) times daily., Disp: 30 tablet, Rfl: 0 .  Melatonin 10 MG CAPS, Take 10 mg by mouth at bedtime., Disp: , Rfl:  .  metFORMIN (GLUMETZA) 1000 MG (MOD) 24 hr tablet, Take 1,000 mg by mouth 2 (two) times daily with a meal. , Disp: , Rfl:  .  metoprolol succinate (TOPROL-XL) 25 MG 24 hr tablet, Take 25 mg by mouth daily. Take with or immediately following a meal. , Disp: , Rfl:  .  nitroGLYCERIN (NITROSTAT) 0.4 MG SL tablet, Place 0.4 mg under the tongue every 5 (five) minutes as needed for chest pain., Disp: , Rfl:  .  nystatin-triamcinolone (MYCOLOG II) cream, Apply to affected area daily, Disp: 15 g, Rfl: 0 .  Olopatadine HCl 0.2 % SOLN, 1 drop to affected eye daily., Disp: 2.5 mL, Rfl: 0 .  QUEtiapine (SEROQUEL) 25 MG tablet, Take 25 mg by mouth at bedtime. , Disp: , Rfl:   Allergies Ace inhibitors and Tetracyclines & related  Family History  Problem Relation Age of Onset  . Heart failure Father     Social History Social History   Tobacco Use  . Smoking status: Former Smoker    Types: E-cigarettes  . Smokeless tobacco: Current User  Substance Use Topics  . Alcohol use: Yes    Comment: OCCAS  . Drug use: No    Review of Systems Constitutional: No fever. Eyes: Positive blurry vision. ENT: No sore throat. Cardiovascular: Denies chest pain. Respiratory: Denies shortness of breath. Gastrointestinal: No abdominal pain.  No vomiting.  Skin: Negative for rash. Neurological: Positive confusion.  Positive unsteady gait.  Negative for numbness.  ____________________________________________   PHYSICAL EXAM:  VITAL SIGNS: ED Triage Vitals  Enc Vitals Group     BP 05/31/19 1230 115/70     Pulse Rate 05/31/19 1230 69     Resp 05/31/19 1230 18     Temp --      Temp src --       SpO2 05/31/19 1230 99 %     Weight 05/31/19 1228 236 lb 15.9 oz (107.5 kg)     Height 05/31/19 1228 5\' 4"  (1.626 m)     Head Circumference --      Peak Flow --      Pain Score 05/31/19 1228 0     Pain Loc --      Pain Edu? --      Excl. in [place]? --     Constitutional: Alert and oriented. Well appearing and in no acute distress. Eyes: Conjunctivae  are normal. PERRL. EOMI. ENT      Head: Normocephalic and atraumatic. Cardiovascular: Normal rate, regular rhythm. Grossly normal heart sounds.  Good peripheral circulation. Respiratory: Normal respiratory effort without tachypnea nor retractions. Breath sounds are clear and equal bilaterally. No wheezes, rales, rhonchi. Musculoskeletal: No extremity edema noted. Neurologic:  Normal speech and language.  No paresthesias noted to face, upper or lower extremity.  Negative pronator drift.  5 out of 5 strength to bilateral upper and lower extremities.  Mild ataxia noted with finger-to-nose. Skin:  Skin is warm, dry and intact. No rash noted. Psychiatric: Mood and affect are normal. Speech and behavior are normal. Patient exhibits appropriate insight and judgment   ___________________________________________   LABS (all labs ordered are listed, but only abnormal results are displayed)  Labs Reviewed  GLUCOSE, CAPILLARY - Abnormal; Notable for the following components:      Result Value   Glucose-Capillary 131 (*)    All other components within normal limits  CBG MONITORING, ED   ____________________________________________  EKG  ED ECG REPORT I, Marylene Land, the attending provider, personally viewed and interpreted this ECG.   Date: 05/31/2019  EKG Time: 1236  Rate: 67  Rhythm: Normal sinus rhythm with sinus arrhythmia.  Axis: normal  ST&T Change: none   PROCEDURES Procedures    INITIAL IMPRESSION / ASSESSMENT AND PLAN / ED COURSE  Pertinent labs & imaging results that were available during my care of the patient were  reviewed by me and considered in my medical decision making (see chart for details).  Patient with multiple comorbidities with previous CVA currently on Plavix presenting for acute confusion and unsteady gait.  Last known well last night going to bed to, symptoms present upon awakening at 830 this morning.  Recommend further evaluation emergency room at this time, by EMS.  Patient agrees.  Blood sugar 131.  IV line established.  Transfer by EMS to [place] [facility].  [facility] called and given report.  Triage nurse.  ____________________________________________   FINAL CLINICAL IMPRESSION(S) / ED DIAGNOSES  Final diagnoses:  Confusion  Unsteady gait     ED Discharge Orders    None       Note: This dictation was prepared with Dragon dictation along with smaller phrase technology. Any transcriptional errors that result from this process are unintentional.           Marylene Land, NP 05/31/19 1251

## 2019-05-31 NOTE — ED Triage Notes (Signed)
Patient states she was confused and disoriented this morning. She states she was trying to go to [facility] and ended up going the wrong way. States she is very unsteady and shaky. She has had 2 previous strokes and is concerned.

## 2019-06-03 LAB — URINE CULTURE: Culture: >=100000 — AB

## 2019-12-13 ENCOUNTER — Encounter: Payer: Self-pay | Admitting: Ophthalmology

## 2019-12-13 ENCOUNTER — Other Ambulatory Visit: Payer: Self-pay

## 2019-12-14 ENCOUNTER — Other Ambulatory Visit
Admission: RE | Admit: 2019-12-14 | Discharge: 2019-12-14 | Disposition: A | Discharge status: Home / Self Care | Payer: Medicare Other | Source: Ambulatory Visit | Attending: Ophthalmology | Admitting: Ophthalmology

## 2019-12-14 DIAGNOSIS — Z20822 Contact with and (suspected) exposure to covid-19: Secondary | ICD-10-CM | POA: Diagnosis not present

## 2019-12-14 DIAGNOSIS — Z01812 Encounter for preprocedural laboratory examination: Secondary | ICD-10-CM | POA: Insufficient documentation

## 2019-12-14 LAB — SARS CORONAVIRUS 2 (TAT 6-24 HRS): SARS Coronavirus 2: NEGATIVE

## 2019-12-14 NOTE — Discharge Instructions (Signed)
INSTRUCTIONS FOLLOWING OCULOPLASTIC SURGERY AMY M. FOWLER, MD  AFTER YOUR EYE SURGERY, THER ARE MANY THINGS WHICH YOU, THE PATIENT, CAN DO TO ASSURE THE BEST POSSIBLE RESULT FROM YOUR OPERATION.  THIS SHEET SHOULD BE REFERRED TO WHENEVER QUESTIONS ARISE.  IF THERE ARE ANY QUESTIONS NOT ANSWERED HERE, DO NOT HESITATE TO CALL OUR OFFICE AT [phone] OR [phone].  THERE IS ALWAYS SOMEONE AVAILABLE TO CALL IF QUESTIONS OR PROBLEMS ARISE.  VISION: Your vision may be blurred and out of focus after surgery until you are able to stop using your ointment, swelling resolves and your eye(s) heal. This may take 1 to 2 weeks at the least.  If your vision becomes gradually more dim or dark, this is not normal and you need to call our office immediately.  EYE CARE: For the first 48 hours after surgery, use ice packs frequently - "20 minutes on, 20 minutes off" - to help reduce swelling and bruising.  Small bags of frozen peas or corn make good ice packs along with cloths soaked in ice water.  If you are wearing a patch or other type of dressing following surgery, keep this on for the amount of time specified by your doctor.  For the first week following surgery, you will need to treat your stitches with great care.  It is OK to shower, but take care to not allow soapy water to run into your eye(s) to help reduce chances of infection.  You may gently clean the eyelashes and around the eye(s) with cotton balls and sterile water, BUT DO NOT RUB THE STITCHES VIGOROUSLY.  Keeping your stitches moist with ointment will help promote healing with minimal scar formation.  ACTIVITY: When you leave the surgery center, you should go home, rest and be inactive.  The eye(s) may feel scratchy and keeping the eyes closed will allow for faster healing.  The first week following surgery, avoid straining (anything making the face turn red) or lifting over 20 pounds.  Additionally, avoid bending which causes your head to go below  your waist.  Using your eyes will NOT harm them, so feel free to read, watch television, use the computer, etc as desired.  Driving depends on each individual, so check with your doctor if you have questions about driving. Do not wear contact lenses for about 2 weeks.  Do not wear eye makeup for 2 weeks.  Avoid swimming, hot tubs, gardening, and dusting for 1 to 2 weeks to reduce the risk of an infection.  MEDICATIONS:  You will be given a prescription for an ointment to use 4 times a day on your stitches.  You can use the ointment in your eyes if they feel scratchy or irritated.  If you eyelid(s) don't close completely when you sleep, put some ointment in your eyes before bedtime.  EMERGENCY: If you experience SEVERE EYE PAIN OR HEADACHE UNRELIEVED BY TYLENOL OR TRAMADOL, NAUSEA OR VOMITING, WORSENING REDNESS, OR WORSENING VISION (ESPECIALLY VISION THAT WAS INITIALLY BETTER) CALL [phone] OR [phone] DURING BUSINESS HOURS OR AFTER HOURS.  General Anesthesia, Adult, Care After This sheet gives you information about how to care for yourself after your procedure. Your health care provider may also give you more specific instructions. If you have problems or questions, contact your health care provider. What can I expect after the procedure? After the procedure, the following side effects are common:  Pain or discomfort at the IV site.  Nausea.  Vomiting.  Sore throat.  Trouble concentrating.    Feeling cold or chills.  Weak or tired.  Sleepiness and fatigue.  Soreness and body aches. These side effects can affect parts of the body that were not involved in surgery. Follow these instructions at home:  For at least 24 hours after the procedure:  Have a responsible adult stay with you. It is important to have someone help care for you until you are awake and alert.  Rest as needed.  Do not: ? Participate in activities in which you could fall or become injured. ? Drive. ? Use  heavy machinery. ? Drink alcohol. ? Take sleeping pills or medicines that cause drowsiness. ? Make important decisions or sign legal documents. ? Take care of children on your own. Eating and drinking  Follow any instructions from your health care provider about eating or drinking restrictions.  When you feel hungry, start by eating small amounts of foods that are soft and easy to digest (bland), such as toast. Gradually return to your regular diet.  Drink enough fluid to keep your urine pale yellow.  If you vomit, rehydrate by drinking water, juice, or clear broth. General instructions  If you have sleep apnea, surgery and certain medicines can increase your risk for breathing problems. Follow instructions from your health care provider about wearing your sleep device: ? Anytime you are sleeping, including during daytime naps. ? While taking prescription pain medicines, sleeping medicines, or medicines that make you drowsy.  Return to your normal activities as told by your health care provider. Ask your health care provider what activities are safe for you.  Take over-the-counter and prescription medicines only as told by your health care provider.  If you smoke, do not smoke without supervision.  Keep all follow-up visits as told by your health care provider. This is important. Contact a health care provider if:  You have nausea or vomiting that does not get better with medicine.  You cannot eat or drink without vomiting.  You have pain that does not get better with medicine.  You are unable to pass urine.  You develop a skin rash.  You have a fever.  You have redness around your IV site that gets worse. Get help right away if:  You have difficulty breathing.  You have chest pain.  You have blood in your urine or stool, or you vomit blood. Summary  After the procedure, it is common to have a sore throat or nausea. It is also common to feel tired.  Have a  responsible adult stay with you for the first 24 hours after general anesthesia. It is important to have someone help care for you until you are awake and alert.  When you feel hungry, start by eating small amounts of foods that are soft and easy to digest (bland), such as toast. Gradually return to your regular diet.  Drink enough fluid to keep your urine pale yellow.  Return to your normal activities as told by your health care provider. Ask your health care provider what activities are safe for you. This information is not intended to replace advice given to you by your health care provider. Make sure you discuss any questions you have with your health care provider. Document Revised: 12/27/2016 Document Reviewed: 08/09/2016 Elsevier Patient Education  2020 Elsevier Inc.  

## 2019-12-16 ENCOUNTER — Encounter
Admission: RE | Disposition: A | Discharge status: Home / Self Care | Payer: Self-pay | Source: Home / Self Care | Attending: Ophthalmology

## 2019-12-16 ENCOUNTER — Ambulatory Visit: Payer: Medicare Other | Admitting: Anesthesiology

## 2019-12-16 ENCOUNTER — Other Ambulatory Visit: Payer: Self-pay

## 2019-12-16 ENCOUNTER — Encounter: Payer: Self-pay | Admitting: Ophthalmology

## 2019-12-16 ENCOUNTER — Ambulatory Visit
Admission: RE | Admit: 2019-12-16 | Discharge: 2019-12-16 | Disposition: A | Discharge status: Home / Self Care | Payer: Medicare Other | Attending: Ophthalmology | Admitting: Ophthalmology

## 2019-12-16 DIAGNOSIS — Z794 Long term (current) use of insulin: Secondary | ICD-10-CM | POA: Diagnosis not present

## 2019-12-16 DIAGNOSIS — Z888 Allergy status to other drugs, medicaments and biological substances status: Secondary | ICD-10-CM | POA: Insufficient documentation

## 2019-12-16 DIAGNOSIS — H02831 Dermatochalasis of right upper eyelid: Secondary | ICD-10-CM | POA: Insufficient documentation

## 2019-12-16 DIAGNOSIS — Z7984 Long term (current) use of oral hypoglycemic drugs: Secondary | ICD-10-CM | POA: Diagnosis not present

## 2019-12-16 DIAGNOSIS — L574 Cutis laxa senilis: Secondary | ICD-10-CM | POA: Insufficient documentation

## 2019-12-16 DIAGNOSIS — Z881 Allergy status to other antibiotic agents status: Secondary | ICD-10-CM | POA: Diagnosis not present

## 2019-12-16 DIAGNOSIS — Z7902 Long term (current) use of antithrombotics/antiplatelets: Secondary | ICD-10-CM | POA: Insufficient documentation

## 2019-12-16 DIAGNOSIS — H02834 Dermatochalasis of left upper eyelid: Secondary | ICD-10-CM | POA: Diagnosis not present

## 2019-12-16 DIAGNOSIS — H57813 Brow ptosis, bilateral: Secondary | ICD-10-CM | POA: Diagnosis not present

## 2019-12-16 HISTORY — DX: Sleep apnea, unspecified: G47.30

## 2019-12-16 HISTORY — DX: Presence of dental prosthetic device (complete) (partial): Z97.2

## 2019-12-16 HISTORY — PX: BROW LIFT: SHX178

## 2019-12-16 LAB — GLUCOSE, CAPILLARY
Glucose-Capillary: 117 mg/dL — ABNORMAL HIGH (ref 70–99)
Glucose-Capillary: 119 mg/dL — ABNORMAL HIGH (ref 70–99)

## 2019-12-16 SURGERY — BLEPHAROPLASTY
Anesthesia: Monitor Anesthesia Care | Laterality: Bilateral

## 2019-12-16 MED ORDER — LIDOCAINE-EPINEPHRINE 2 %-1:100000 IJ SOLN
INTRAMUSCULAR | Status: DC | PRN
  Administered 2019-12-16: 6 mL via OPHTHALMIC

## 2019-12-16 MED ORDER — TRAMADOL HCL 50 MG PO TABS: ORAL_TABLET | ORAL | 0 refills | Status: DC

## 2019-12-16 MED ORDER — MIDAZOLAM HCL 2 MG/2ML IJ SOLN
INTRAMUSCULAR | Status: DC | PRN
  Administered 2019-12-16: 1 mg via INTRAVENOUS

## 2019-12-16 MED ORDER — LACTATED RINGERS IV SOLN: INTRAVENOUS | Status: DC | PRN

## 2019-12-16 MED ORDER — ALFENTANIL 500 MCG/ML IJ INJ
INJECTION | INTRAVENOUS | Status: DC | PRN
  Administered 2019-12-16: 500 ug via INTRAVENOUS
  Administered 2019-12-16: 200 ug via INTRAVENOUS
  Administered 2019-12-16: 300 ug via INTRAVENOUS

## 2019-12-16 MED ORDER — LIDOCAINE HCL (CARDIAC) PF 100 MG/5ML IV SOSY
PREFILLED_SYRINGE | INTRAVENOUS | Status: DC | PRN
  Administered 2019-12-16: 50 mg via INTRAVENOUS

## 2019-12-16 MED ORDER — ERYTHROMYCIN 5 MG/GM OP OINT: TOPICAL_OINTMENT | OPHTHALMIC | 2 refills | Status: DC

## 2019-12-16 MED ORDER — TETRACAINE HCL 0.5 % OP SOLN
OPHTHALMIC | Status: DC | PRN
  Administered 2019-12-16: 2 [drp]

## 2019-12-16 MED ORDER — PROPOFOL 500 MG/50ML IV EMUL
INTRAVENOUS | Status: DC | PRN
  Administered 2019-12-16: 25 ug/kg/min via INTRAVENOUS

## 2019-12-16 MED ORDER — ERYTHROMYCIN 5 MG/GM OP OINT
TOPICAL_OINTMENT | OPHTHALMIC | Status: DC | PRN
  Administered 2019-12-16: 1 via OPHTHALMIC

## 2019-12-16 SURGICAL SUPPLY — 37 items
APPLICATOR COTTON TIP WD 3 STR (MISCELLANEOUS) ×3 IMPLANT
BLADE SURG 15 STRL LF DISP TIS (BLADE) ×2 IMPLANT
BLADE SURG 15 STRL SS (BLADE) ×6
CORD BIP STRL DISP 12FT (MISCELLANEOUS) ×3 IMPLANT
DRAPE HEAD BAR (DRAPES) ×3 IMPLANT
GAUZE SPONGE 4X4 12PLY STRL (GAUZE/BANDAGES/DRESSINGS) ×3 IMPLANT
GLOVE SURG LX 7.0 MICRO (GLOVE) ×4
GLOVE SURG LX STRL 7.0 MICRO (GLOVE) ×2 IMPLANT
GOWN STRL REUS W/ TWL LRG LVL3 (GOWN DISPOSABLE) ×1 IMPLANT
GOWN STRL REUS W/TWL LRG LVL3 (GOWN DISPOSABLE) ×3
MARKER SKIN XFINE TIP W/RULER (MISCELLANEOUS) ×3 IMPLANT
NEEDLE FILTER BLUNT 18X 1/2SAF (NEEDLE) ×2
NEEDLE FILTER BLUNT 18X1 1/2 (NEEDLE) ×1 IMPLANT
NEEDLE HYPO 30X.5 LL (NEEDLE) ×9 IMPLANT
PACK ENT CUSTOM (PACKS) ×3 IMPLANT
SOL PREP PVP 2OZ (MISCELLANEOUS) ×3
SOLUTION PREP PVP 2OZ (MISCELLANEOUS) ×1 IMPLANT
SPONGE GAUZE 2X2 8PLY STER LF (GAUZE/BANDAGES/DRESSINGS) ×10
SPONGE GAUZE 2X2 8PLY STRL LF (GAUZE/BANDAGES/DRESSINGS) ×20 IMPLANT
SUT CHROMIC 4-0 (SUTURE)
SUT CHROMIC 4-0 M2 12X2 ARM (SUTURE)
SUT CHROMIC 5 0 P 3 (SUTURE) ×6 IMPLANT
SUT ETHILON 4 0 CL P 3 (SUTURE) ×6 IMPLANT
SUT GUT PLAIN 6-0 1X18 ABS (SUTURE) ×3 IMPLANT
SUT MERSILENE 4-0 S-2 (SUTURE) IMPLANT
SUT PROLENE 5 0 P 3 (SUTURE) IMPLANT
SUT PROLENE 6 0 P 1 18 (SUTURE) IMPLANT
SUT SILK 4 0 G 3 (SUTURE) IMPLANT
SUT VIC AB 5-0 P-3 18X BRD (SUTURE) IMPLANT
SUT VIC AB 5-0 P3 18 (SUTURE)
SUT VICRYL 6-0  S14 CTD (SUTURE)
SUT VICRYL 6-0 S14 CTD (SUTURE) IMPLANT
SUT VICRYL 7 0 TG140 8 (SUTURE) IMPLANT
SUTURE CHRMC 4-0 M2 12X2 ARM (SUTURE) IMPLANT
SYR 10ML LL (SYRINGE) ×3 IMPLANT
SYR 3ML LL SCALE MARK (SYRINGE) ×3 IMPLANT
WATER STERILE IRR 250ML POUR (IV SOLUTION) ×3 IMPLANT

## 2019-12-16 NOTE — Interval H&P Note (Signed)
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  The various methods of treatment have been discussed with the patient and family. After consideration of risks, benefits and other options for treatment, the patient has consented to  Procedure(s) with comments: BLEPHAROPLASTY UPPER EYELID; W/EXCESS SKIN BROW PTOSIS REPAIR BILATERAL DIABETIC (Bilateral) - Diabetic - insulin and oral meds as a surgical intervention.  The patient's history has been reviewed, patient examined, no change in status, stable for surgery.  I have reviewed the patient's chart and labs.  Questions were answered to the patient's satisfaction.     Vickki Muff, Durante Violett M

## 2019-12-16 NOTE — Transfer of Care (Signed)
Immediate Anesthesia Transfer of Care Note  Patient: Haley Mooney  Procedure(s) Performed: BLEPHAROPLASTY UPPER EYELID; W/EXCESS SKIN BROW PTOSIS REPAIR BILATERAL DIABETIC (Bilateral )  Patient Location: PACU  Anesthesia Type: MAC  Level of Consciousness: awake, alert  and patient cooperative  Airway and Oxygen Therapy: Patient Spontanous Breathing and Patient connected to supplemental oxygen  Post-op Assessment: Post-op Vital signs reviewed, Patient's Cardiovascular Status Stable, Respiratory Function Stable, Patent Airway and No signs of Nausea or vomiting  Post-op Vital Signs: Reviewed and stable  Complications: No complications documented.

## 2019-12-16 NOTE — Anesthesia Preprocedure Evaluation (Signed)
Anesthesia Evaluation  Patient identified by MRN, date of birth, ID band Patient awake    Reviewed: Allergy & Precautions, H&P , NPO status , Patient's Chart, lab work & pertinent test results  Airway Mallampati: II  TM Distance: >3 FB Neck ROM: full    Dental  (+) Partial Lower, Upper Dentures   Pulmonary sleep apnea , former smoker,    Pulmonary exam normal breath sounds clear to auscultation       Cardiovascular hypertension, + CAD  Normal cardiovascular exam Rhythm:regular Rate:Normal     Neuro/Psych PSYCHIATRIC DISORDERS CVA    GI/Hepatic GERD  ,  Endo/Other  diabetes  Renal/GU      Musculoskeletal   Abdominal   Peds  Hematology   Anesthesia Other Findings   Reproductive/Obstetrics                             Anesthesia Physical Anesthesia Plan  ASA: III  Anesthesia Plan: MAC   Post-op Pain Management:    Induction:   PONV Risk Score and Plan: 2 and Treatment may vary due to age or medical condition, TIVA and Midazolam  Airway Management Planned:   Additional Equipment:   Intra-op Plan:   Post-operative Plan:   Informed Consent: I have reviewed the patients History and Physical, chart, labs and discussed the procedure including the risks, benefits and alternatives for the proposed anesthesia with the patient or authorized representative who has indicated his/her understanding and acceptance.     Dental Advisory Given  Plan Discussed with: CRNA  Anesthesia Plan Comments:         Anesthesia Quick Evaluation

## 2019-12-16 NOTE — Op Note (Signed)
Preoperative Diagnosis:   1.  Visually significant bilateral brow ptosis.  2.  Visually significant dermatochalasis bilateral Upper Eyelid(s)  Postoperative Diagnosis: Same.   Procedure(s) Performed:   1. bilateral  Direct brow lift to improve vision.  2.  Upper eyelid blepharoplasty with excess skin excision bilateral  Upper Eyelid(s)  Surgeon: Philis Pique. Vickki Muff, M.D.   Assistants: None   Anesthesia: MAC  Specimens: None.  Estimated Blood Loss: Minimal.  Complications: None.  Operative Findings: None Dictated  PROCEDURE:   Allergies were reviewed and the patient is allergic to ace inhibitors and tetracyclines & related..   After the risks, benefits, complications and alternatives were discussed with the patient, appropriate informed consent was obtained. While seated in an upright position and looking in primary gaze, the amount of supra-brow skin to be removed was measured and marked in an elliptical pattern. The patient was then brought to the operating suite and reclined supine.  Timeout was conducted and the patient was sedated. Local anesthetic consisting of a 50-50 mixture of 2% lidocaine with epinephrine and 0.75% bupivacaine with added Hylenex was injected subcutaneously to the bilateral  brow region(s) and down to the periosteum and  subcutaneously to both  upper eyelid(s). After adequate local was instilled, the patient was prepped and draped in the usual sterile fashion for eyelid surgery.   Attention was turned to the right brow region. A #15 blade was used to create a bevelled incision along the premarked incision line. A skin and subcutaneous tissue flap was then excised and hemostasis was obtained with bipolar cautery. The deep tissues were reapproximated with interrupted vertical 5-0 chromic sutures. The skin margin was reapproximated with a running locking 5-0 Prolene suture. Attention was then turned to the opposite brow region where the same procedure was performed in  the same manner.    Attention was then turned to the upper eyelids. A 30m upper eyelid crease incision line was marked with calipers on both  upper eyelid(s).  A pinch test was used to estimate the amount of excess skin to remove and this was marked in standard blepharoplasty style fashion. Attention was turned to the right  upper eyelid. A #15 blade was used to open the premarked incision line. A Skin only flap was excised and hemostasis was obtained with bipolar cautery.   Attention was then turned to the opposite eyelid where the same procedure was performed in the same manner.   The skin incisions were closed with a combination of interrupted and  running 6-0 fast absorbing plain gut suture.   The patient tolerated the procedure well. Erythromycin ophthalmic ointment was applied to the incision site(s) followed by ice packs.The patient was taken to the recovery area where she recovered without difficulty.  Post-Op Plan/Instructions:  The patient was instructed to use ice packs frequently for the next 48 hours.  she was instructed to use Erythromycin ophthalmic ophthalmic ointment on the eyelid incisions and over-the-counter antibiotic ointment on the brow sutures 4 times a day for the next 12 to 14 days. she was given a prescription for Tramadol (or similar) for pain control should Tylenol not be effective. she was asked to to follow up in 10-12 days time for suture removal or sooner as needed for problems.   Lekeshia Kram M. FVickki Muff M.D. Ophthalmology

## 2019-12-16 NOTE — Anesthesia Procedure Notes (Signed)
Performed by: Jalan Bodi, CRNA Pre-anesthesia Checklist: Patient identified, Emergency Drugs available, Suction available, Timeout performed and Patient being monitored Patient Re-evaluated:Patient Re-evaluated prior to induction Oxygen Delivery Method: Nasal cannula Placement Confirmation: positive ETCO2       

## 2019-12-16 NOTE — H&P (Signed)
  See the history and physical completed at [facility] on 12/09/2019 and scanned into the chart.

## 2019-12-16 NOTE — Anesthesia Postprocedure Evaluation (Signed)
Anesthesia Post Note  Patient: Haley Mooney  Procedure(s) Performed: BLEPHAROPLASTY UPPER EYELID; W/EXCESS SKIN BROW PTOSIS REPAIR BILATERAL DIABETIC (Bilateral )     Patient location during evaluation: PACU Anesthesia Type: MAC Level of consciousness: awake and alert and oriented Pain management: satisfactory to patient Vital Signs Assessment: post-procedure vital signs reviewed and stable Respiratory status: spontaneous breathing, nonlabored ventilation and respiratory function stable Cardiovascular status: blood pressure returned to baseline and stable Postop Assessment: Adequate PO intake and No signs of nausea or vomiting Anesthetic complications: no   No complications documented.  Raliegh Ip

## 2019-12-17 ENCOUNTER — Encounter: Payer: Self-pay | Admitting: Ophthalmology

## 2022-02-20 IMAGING — CT CT HEAD W/O CM
3 series · 15 of 47 positions shown, 18 images · non-contrast
Comparison: August 21, 2017

CLINICAL DATA: Confusion/disorientation.  Unsteady gait

EXAM:
CT HEAD WITHOUT CONTRAST
TECHNIQUE: Contiguous axial images were obtained from the base of the skull
through the vertex without intravenous contrast.

[Series 2: head wo · axial · 0.41mm/px · z∈[+666,+791]mm · 9 of 31 slices shown, 12 images]
[im 3/31  brain]
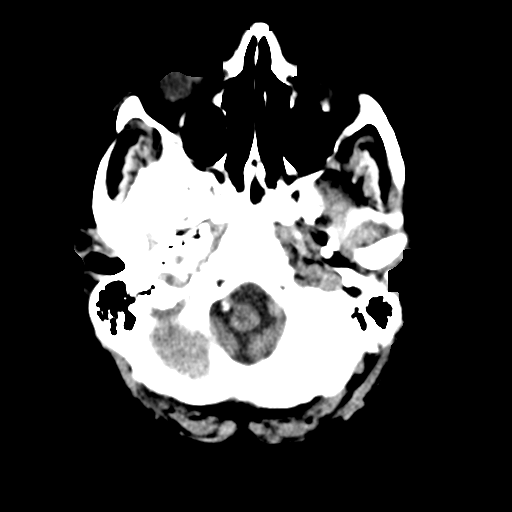
[im 3/31  bone]
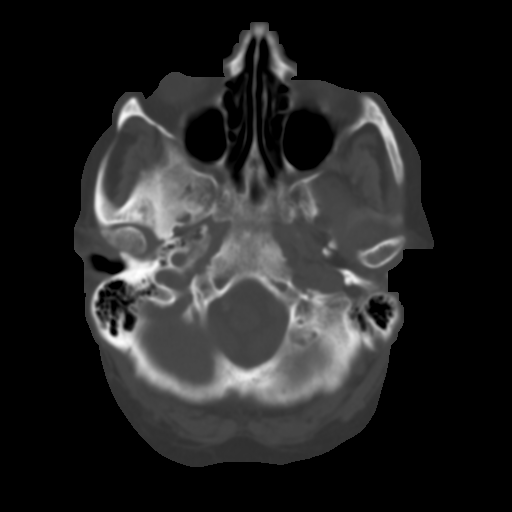
[im 6/31  brain]
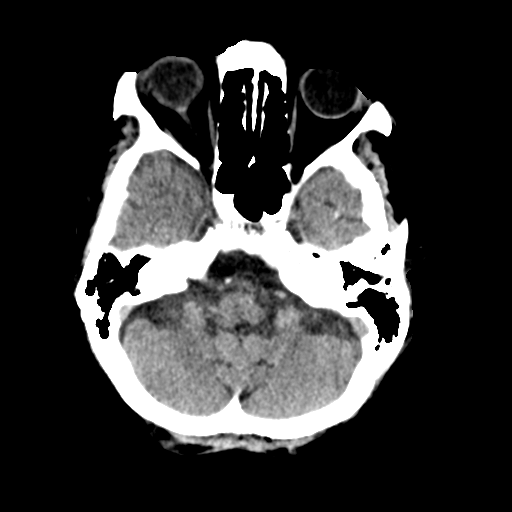
[im 9/31  brain]
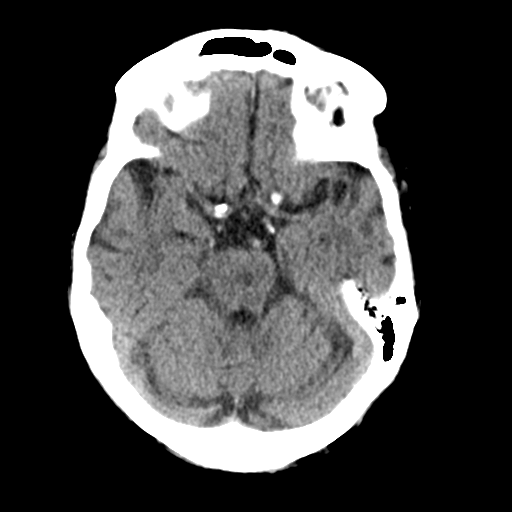
[im 12/31  brain]
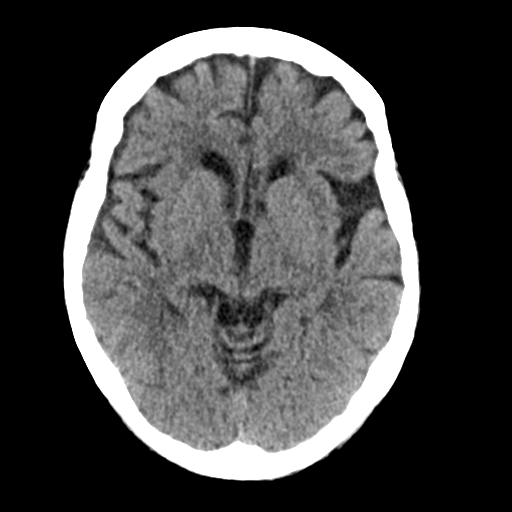
[im 16/31  brain]
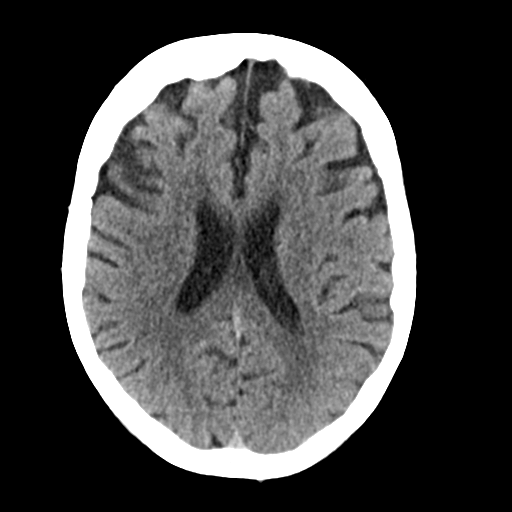
[im 16/31  bone]
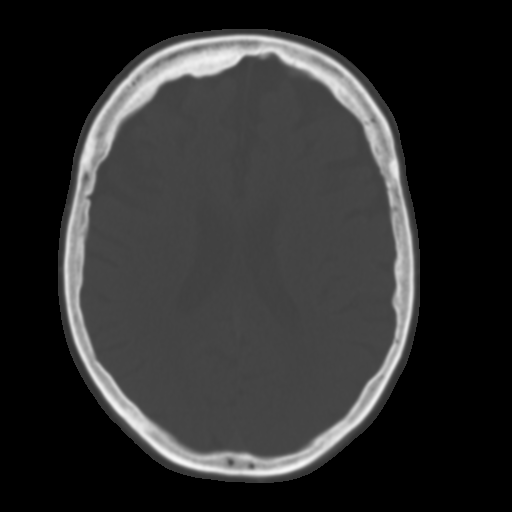
[im 19/31  brain]
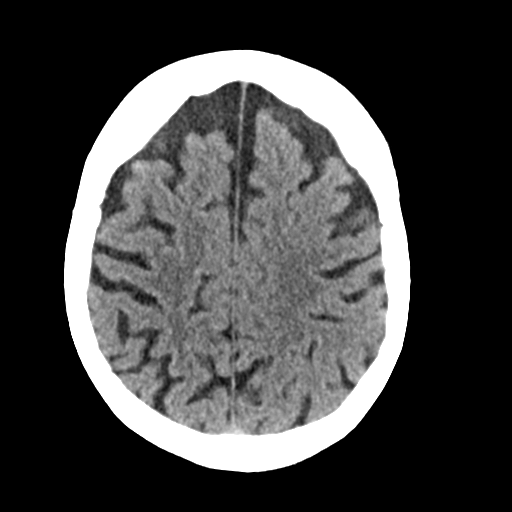
[im 22/31  brain]
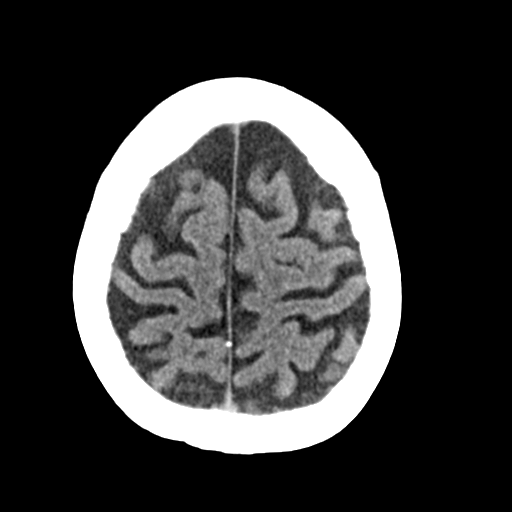
[im 25/31  brain]
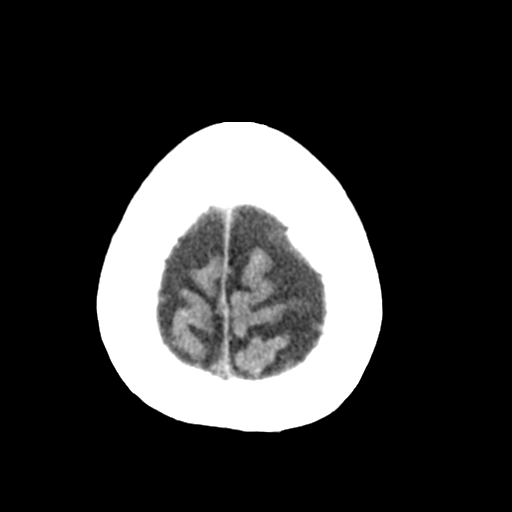
[im 28/31  brain]
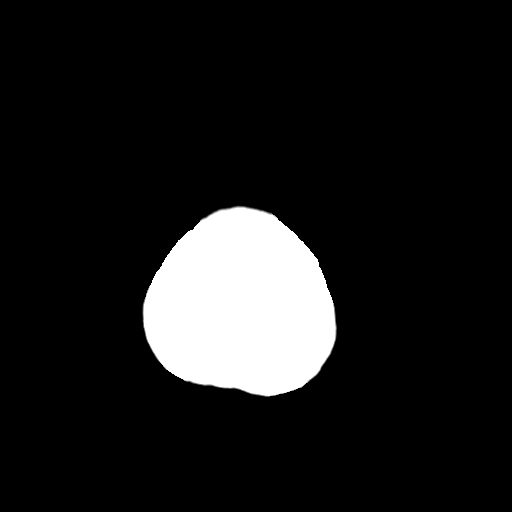
[im 28/31  bone]
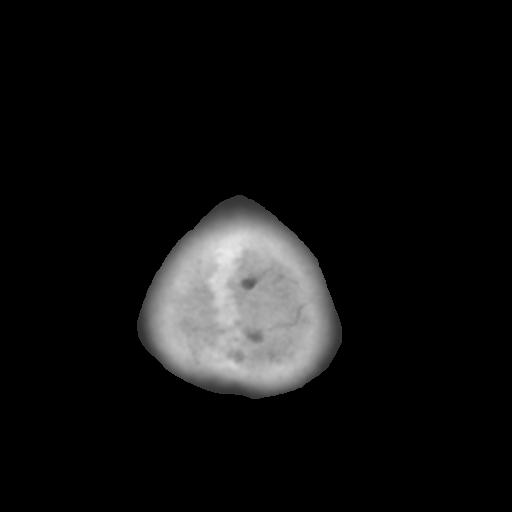

[Series 4: coronal soft tissue · coronal · 0.31mm/px · 3 of 66 slices shown]
[im 22/66  brain]
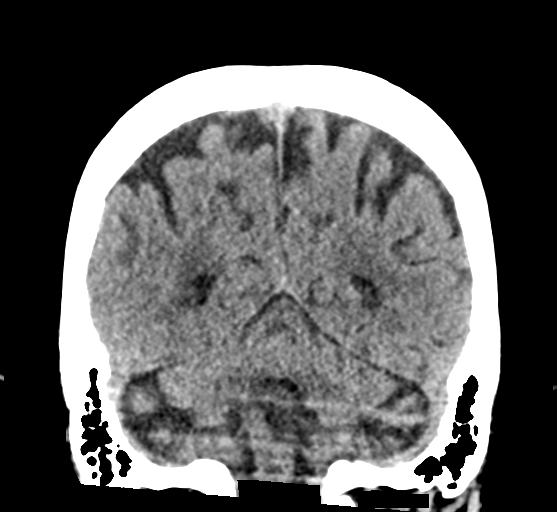
[im 29/66  brain]
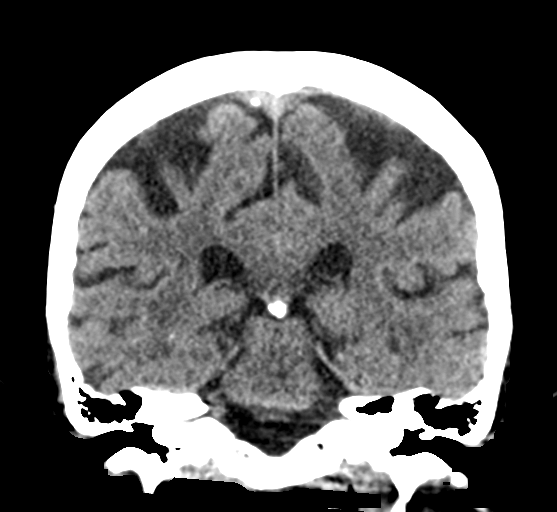
[im 37/66  brain]
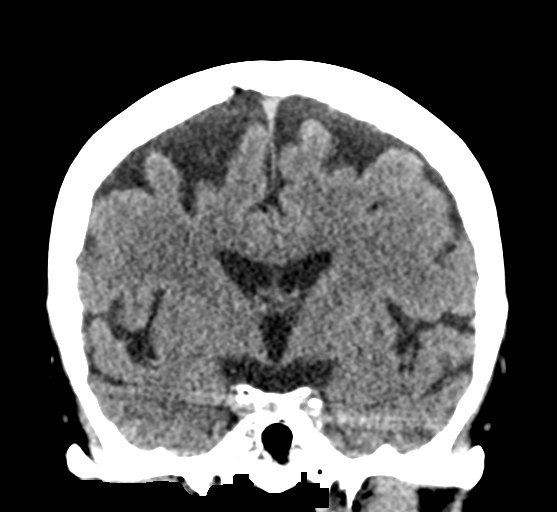

[Series 5: sagittal soft tissue · sagittal · 0.31mm/px · 3 of 53 slices shown]
[im 18/53  brain]
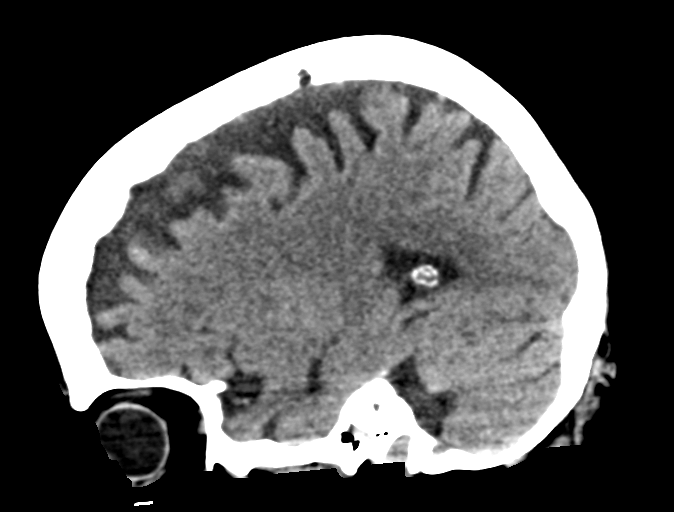
[im 27/53  brain]
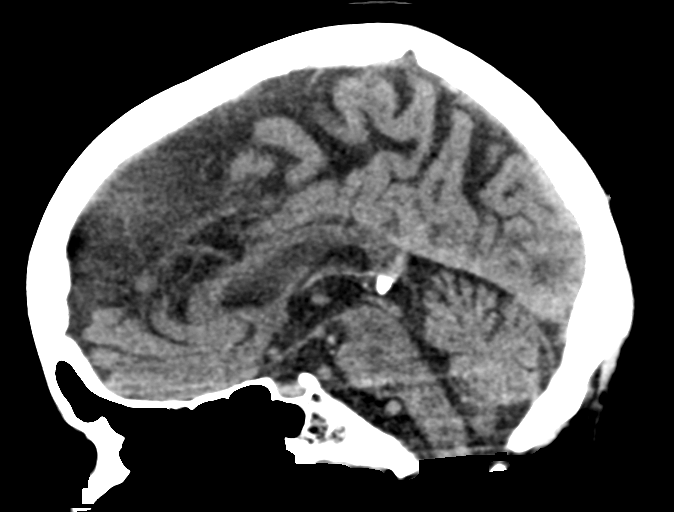
[im 35/53  brain]
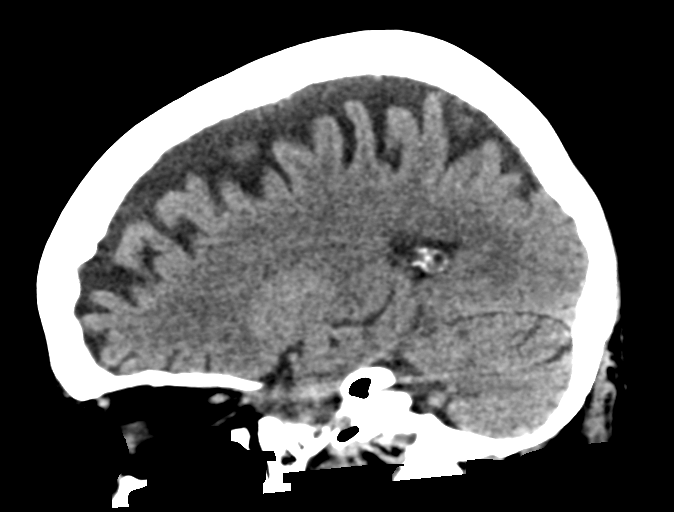

[15 of 47 positions shown; findings below may reference images not displayed]

FINDINGS: Brain: There is mild diffuse atrophy. There is no intracranial mass,
hemorrhage, extra-axial fluid collection, or midline shift. There is
slight small vessel disease in the centra semiovale bilaterally. A
prior infarct in the medial left pons is evident, better seen on
prior MR. No acute appearing infarct is appreciable.

Vascular: No hyperdense vessel. There is calcification in each
distal vertebral artery and carotid siphon region.

Skull: Bony calvarium appears intact.

Sinuses/Orbits: There is opacification in the anterior aspect of the
superior right maxillary antrum. Other visualized paranasal sinuses
are clear. Orbits appear symmetric bilaterally.

Other: Visualized mastoid air cells are clear.
IMPRESSION: Prior infarct in the medial left pons. Atrophy with mild
periventricular small vessel disease. No acute infarct. No mass or
hemorrhage.

Foci of arterial vascular calcification noted. Opacification in a
portion of the anterior aspect of the superior right maxillary
antrum.

## 2022-09-10 ENCOUNTER — Ambulatory Visit
Admission: EM | Admit: 2022-09-10 | Discharge: 2022-09-10 | Disposition: A | Discharge status: Home / Self Care | Payer: Medicare Other | Attending: Emergency Medicine | Admitting: Emergency Medicine

## 2022-09-10 ENCOUNTER — Ambulatory Visit (INDEPENDENT_AMBULATORY_CARE_PROVIDER_SITE_OTHER): Payer: Medicare Other

## 2022-09-10 DIAGNOSIS — Z794 Long term (current) use of insulin: Secondary | ICD-10-CM | POA: Insufficient documentation

## 2022-09-10 DIAGNOSIS — F17290 Nicotine dependence, other tobacco product, uncomplicated: Secondary | ICD-10-CM | POA: Insufficient documentation

## 2022-09-10 DIAGNOSIS — B3731 Acute candidiasis of vulva and vagina: Secondary | ICD-10-CM | POA: Diagnosis not present

## 2022-09-10 DIAGNOSIS — N898 Other specified noninflammatory disorders of vagina: Secondary | ICD-10-CM | POA: Insufficient documentation

## 2022-09-10 DIAGNOSIS — R0989 Other specified symptoms and signs involving the circulatory and respiratory systems: Secondary | ICD-10-CM | POA: Diagnosis not present

## 2022-09-10 DIAGNOSIS — R059 Cough, unspecified: Secondary | ICD-10-CM | POA: Diagnosis not present

## 2022-09-10 DIAGNOSIS — E119 Type 2 diabetes mellitus without complications: Secondary | ICD-10-CM | POA: Diagnosis not present

## 2022-09-10 LAB — WET PREP, GENITAL
Clue Cells Wet Prep HPF POC: NONE SEEN
Sperm: NONE SEEN
Trich, Wet Prep: NONE SEEN
WBC, Wet Prep HPF POC: <10 — AB (ref <10)

## 2022-09-10 MED ORDER — CLOTRIMAZOLE 1 % VA CREA: 1.0000 | TOPICAL_CREAM | Freq: Every day | VAGINAL | 0 refills | Status: DC

## 2022-09-10 MED ORDER — FLUCONAZOLE 150 MG PO TABS
150.0000 mg | ORAL_TABLET | Freq: Once | ORAL | 0 refills | Status: AC
Start: 2022-09-10 — End: 2022-09-10

## 2022-09-10 NOTE — ED Triage Notes (Signed)
Patient states that she's had a yeast infection off and on for a while. She states that her perineum it raw and itching. Anus is also itching.

## 2022-09-10 NOTE — Discharge Instructions (Addendum)
You do have a vaginal yeast infection.  Take 1 dose of Diflucan.  I have also called in clotrimazole cream to be used at night for 1 week.  Make sure that your blood sugars are appropriately managed.  Follow-up with your primary care if symptoms or not improving.  If anything worsens please return for reevaluation.  I am still waiting on your chest x-ray report.  If this is abnormal I will call you.  Follow-up with your primary care.  If you develop any chest pain, shortness of breath, weakness, heart racing, fever you need to go to the emergency room.

## 2022-09-10 NOTE — ED Provider Notes (Signed)
Patient presents [facility]    CSN: [id number] Arrival date & time: 09/10/22  1526      History   Chief Complaint Chief Complaint  Patient presents with   Vaginal Itching    HPI Haley Mooney is a 82 y.o. female.   Patient presents today with a several week history of intermittent vaginal irritation and pruritus.  Reports that today it is significantly worsened and she is not experiencing a burning and pruritic sensation on her rectum.  She denies any changes to personal hygiene products including soaps or detergents.  Denies any recent antibiotics.  Denies any recent medication changes.  She has not tried any over-the-counter medication to treat yeast infection.  She denies any urinary symptoms, abdominal pain, back pain, fever, nausea, vomiting.  She is having normal bowel movements.  She does have a history of diabetes and reports that her blood sugars run a little bit high.  She is compliant with her insulin.  She does not take SGLT2 inhibitor.  Patient was noted to have rales on exam.  Her oxygen saturation is 93% which is decreased from her normal of 99-100%.  She does vape and is a former smoker but does not smoke cigarettes anymore.  She does report occasional cough but denies any additional symptoms including fever or congestion.  Denies any recent antibiotic use.    Past Medical History:  Diagnosis Date   Anxiety    Arthritis    Bipolar disorder (HCC)    CAD (coronary artery disease)    Cancer (HCC)    SKIN   Depression    Diabetes mellitus without complication (HCC)    GERD (gastroesophageal reflux disease)    H/O   Heart murmur    Hypertension    Neuromuscular disorder (HCC)    NEUROPATHY   Pneumonia    IN PAST   Shortness of breath dyspnea    Sleep apnea    CPAP   Stroke [facility])    Wears dentures    full upper, partial lower    Patient Active Problem List   Diagnosis Date Noted   CVA (cerebral vascular accident) (HCC) 08/21/2017    Past  Surgical History:  Procedure Laterality Date   ABDOMINAL HYSTERECTOMY     APPENDECTOMY     BACK SURGERY     BLADDER SUSPENSION     BROW LIFT Bilateral 12/16/2019   Procedure: BLEPHAROPLASTY UPPER EYELID; W/EXCESS SKIN BROW PTOSIS REPAIR BILATERAL DIABETIC;  Surgeon: Imagene Riches, MD;  Location: [facility];  Service: Ophthalmology;  Laterality: Bilateral;  Diabetic - insulin and oral meds   CATARACT EXTRACTION Left    CATARACT EXTRACTION W/PHACO Left 08/08/2015   Procedure: CATARACT EXTRACTION PHACO AND INTRAOCULAR LENS PLACEMENT (IOC);  Surgeon: Galen Manila, MD;  Location: [facility];  Service: Ophthalmology;  Laterality: Left;  [place] 00:55 AP% 17.1 CDE 9.48 fluid pack lot # [id number]   CATARACT EXTRACTION W/PHACO Right 09/05/2015   Procedure: CATARACT EXTRACTION PHACO AND INTRAOCULAR LENS PLACEMENT (IOC);  Surgeon: Galen Manila, MD;  Location: [facility];  Service: Ophthalmology;  Laterality: Right;  Lot# [id number] H US:00:43.0 AP%: 20.3 CDE: 8.74   COLONOSCOPY     CORONARY ANGIOPLASTY WITH STENT PLACEMENT     CATH DONE [facility] 6/17 DR PAULA MILLER   EYE SURGERY     JOINT REPLACEMENT     TONSILLECTOMY      OB History   No obstetric history on file.      Home Medications  Prior to Admission medications   Medication Sig Start Date End Date Taking? Authorizing Provider  atorvastatin (LIPITOR) 80 MG tablet Take 80 mg by mouth daily at 6 PM.   Yes [provider]  clopidogrel (PLAVIX) 75 MG tablet Take 1 tablet (75 mg total) by mouth daily. 08/26/17  Yes Altamese Dilling, MD  clotrimazole (GYNE-LOTRIMIN) 1 % vaginal cream Place 1 Applicatorful vaginally at bedtime. 09/10/22  Yes Ravynn Hogate K, PA-C  fluconazole (DIFLUCAN) 150 MG tablet Take 1 tablet (150 mg total) by mouth once for 1 dose. 09/10/22 09/10/22 Yes Declin Rajan K, PA-C  hydrochlorothiazide (HYDRODIURIL) 25 MG tablet Take 25 mg by mouth daily.   Yes [provider]  insulin detemir (LEVEMIR) 100  UNIT/ML injection Inject 20 Units into the skin 2 (two) times daily.    Yes [provider]  metoprolol succinate (TOPROL-XL) 25 MG 24 hr tablet Take 25 mg by mouth daily. Take with or immediately following a meal.   Yes [provider]  OZEMPIC, 0.25 OR 0.5 MG/DOSE, 2 MG/3ML SOPN Inject 0.5 mg into the skin once a week.   Yes [provider]  QUEtiapine (SEROQUEL) 25 MG tablet Take 25 mg by mouth at bedtime.    Yes [provider]  venlafaxine XR (EFFEXOR-XR) 37.5 MG 24 hr capsule Take by mouth.   Yes [provider]  amLODipine (NORVASC) 10 MG tablet Take 1 tablet (10 mg total) by mouth daily. Patient taking differently: Take 5 mg by mouth daily. 08/26/17   Altamese Dilling, MD  ARIPiprazole (ABILIFY) 5 MG tablet Take 2.5 mg by mouth daily.  Patient not taking: Reported on 12/13/2019    [provider]  clonazePAM (KLONOPIN) 0.5 MG tablet Take 0.5 mg by mouth 3 (three) times daily as needed for anxiety. Patient not taking: Reported on 12/13/2019    [provider]  erythromycin ophthalmic ointment Apply to sutures 4 times a day for 10-12 days.  Discontinue if allergy develops and call our office 12/16/19   Imagene Riches, MD  gabapentin (NEURONTIN) 300 MG capsule Take 1 capsule by mouth daily. 07/15/17   [provider]  Garcinia Cambogia-Chromium 500-200 MG-MCG TABS Take 1 tablet by mouth 2 (two) times daily. Patient not taking: Reported on 12/13/2019    [provider]  meclizine (ANTIVERT) 12.5 MG tablet Take 1 tablet (12.5 mg total) by mouth 3 (three) times daily. Patient not taking: Reported on 12/13/2019 08/25/17   Altamese Dilling, MD  Melatonin 10 MG CAPS Take 10 mg by mouth at bedtime. Patient not taking: Reported on 12/13/2019    [provider]  metFORMIN (GLUMETZA) 1000 MG (MOD) 24 hr tablet Take 1,000 mg by mouth 2 (two) times daily with a meal.     [provider]  nitroGLYCERIN  (NITROSTAT) 0.4 MG SL tablet Place 0.4 mg under the tongue every 5 (five) minutes as needed for chest pain.    [provider]  nystatin-triamcinolone (MYCOLOG II) cream Apply to affected area daily 12/23/15   Coralyn Mark, NP  Olopatadine HCl 0.2 % SOLN 1 drop to affected eye daily. 05/26/16   Tommie Sams, DO  traMADol Janean Sark) 50 MG tablet Take 1 every 4-6 hours as needed for pain not controlled by Tylenol 12/16/19   Imagene Riches, MD    Family History Family History  Problem Relation Age of Onset   Heart failure Father     Social History Social History   Tobacco Use  Smoking status: Former    Types: E-cigarettes    Quit date: 2011    Years since quitting: 13.6   Smokeless tobacco: Current  Vaping Use   Vaping status: Every Day   Substances: Nicotine   Devices: 1-2x/day  Substance Use Topics   Alcohol use: Yes    Comment: OCCAS   Drug use: No     Allergies   Ace inhibitors, Isosorbide, and Tetracyclines & related   Review of Systems Review of Systems  Constitutional:  Positive for activity change. Negative for appetite change, fatigue and fever.  Gastrointestinal:  Negative for abdominal pain, blood in stool, constipation, diarrhea, nausea, rectal pain and vomiting.  Genitourinary:  Positive for vaginal discharge and vaginal pain. Negative for dysuria, frequency, pelvic pain and urgency.     Physical Exam Triage Vital Signs ED Triage Vitals  Encounter Vitals Group     BP 09/10/22 1614 134/75     Systolic BP Percentile --      Diastolic BP Percentile --      Pulse Rate 09/10/22 1614 77     Resp 09/10/22 1614 17     Temp 09/10/22 1614 98.4 F (36.9 C)     Temp Source 09/10/22 1614 Oral     SpO2 09/10/22 1614 93 %     Weight 09/10/22 1611 180 lb (81.6 kg)     Height --      Head Circumference --      Peak Flow --      Pain Score 09/10/22 1610 8     Pain Loc --      Pain Education --      Exclude from Growth Chart --    No data  found.  Updated Vital Signs BP 134/75 (BP Location: Left Arm)   Pulse 77   Temp 98.4 F (36.9 C) (Oral)   Resp 17   Wt 180 lb (81.6 kg)   SpO2 93%   BMI 29.95 kg/m   Visual Acuity Right Eye Distance:   Left Eye Distance:   Bilateral Distance:    Right Eye Near:   Left Eye Near:    Bilateral Near:     Physical Exam Vitals reviewed.  Constitutional:      General: She is awake. She is not in acute distress.    Appearance: Normal appearance. She is well-developed. She is not ill-appearing.     Comments: Very pleasant female appears stated age in no acute distress sitting comfortably in exam room  HENT:     Head: Normocephalic and atraumatic.  Cardiovascular:     Rate and Rhythm: Normal rate and regular rhythm.     Heart sounds: Normal heart sounds, S1 normal and S2 normal. No murmur heard. Pulmonary:     Effort: Pulmonary effort is normal.     Breath sounds: Examination of the right-lower field reveals rales. Rales present. No wheezing or rhonchi.  Abdominal:     Palpations: Abdomen is soft.     Tenderness: There is no abdominal tenderness.  Genitourinary:    Labia:        Right: Rash present. No tenderness.        Left: Rash present. No tenderness.      Rectum: External hemorrhoid present. No internal hemorrhoid.     Comments: Thick white coating with associated erythema noted rectum.  Significant erythema noted labia minora. Psychiatric:        Behavior: Behavior is cooperative.      UC Treatments / Results  Labs (all labs ordered are listed, but only abnormal results are displayed) Labs Reviewed  WET PREP, GENITAL - Abnormal; Notable for the following components:      Result Value   Yeast Wet Prep HPF POC PRESENT (*)    WBC, Wet Prep HPF POC <10 (*)    All other components within normal limits    EKG   Radiology DG Chest 2 View  Result Date: 09/10/2022 CLINICAL DATA:  rales on right EXAM: CHEST - 2 VIEW COMPARISON:  Chest x-ray 08/21/2017 FINDINGS:  The heart and mediastinal contours are unchanged. Aortic calcification. No focal consolidation. No pulmonary edema. No pleural effusion. No pneumothorax. No acute osseous abnormality. IMPRESSION: 1. No active cardiopulmonary disease. 2.  Aortic Atherosclerosis (ICD10-I70.0). Electronically Signed   By: Tish Frederickson M.D.   On: 09/10/2022 19:04    Procedures Procedures (including critical care time)  Medications Ordered in UC Medications - No data to display  Initial Impression / Assessment and Plan / UC Course  I have reviewed the triage vital signs and the nursing notes.  Pertinent labs & imaging results that were available during my care of the patient were reviewed by me and considered in my medical decision making (see chart for details).     Wet prep was collected by provider during exam that showed yeast and we discussed that this is the likely cause of her symptoms.  She was given 1 dose of Diflucan and started on clotrimazole cream.  Recommend she keep the area clean.  Discussed that if her symptoms are not improving within a few days or if she has any worsening or changing symptoms she needs to be seen immediately.  Recommend close follow-up with her primary care.  X-ray was obtained given rales on exam that showed no acute cardiopulmonary disease.  Patient was encouraged to follow-up with her primary care.  Discussed that if she develops any worsening symptoms she needs to be seen immediately.  Final Clinical Impressions(s) / UC Diagnoses   Final diagnoses:  Vaginal yeast infection  Rales     Discharge Instructions      You do have a vaginal yeast infection.  Take 1 dose of Diflucan.  I have also called in clotrimazole cream to be used at night for 1 week.  Make sure that your blood sugars are appropriately managed.  Follow-up with your primary care if symptoms or not improving.  If anything worsens please return for reevaluation.  I am still waiting on your chest x-ray  report.  If this is abnormal I will call you.  Follow-up with your primary care.  If you develop any chest pain, shortness of breath, weakness, heart racing, fever you need to go to the emergency room.     ED Prescriptions     Medication Sig Dispense Auth. Provider   fluconazole (DIFLUCAN) 150 MG tablet Take 1 tablet (150 mg total) by mouth once for 1 dose. 1 tablet Inocencia Murtaugh K, PA-C   clotrimazole (GYNE-LOTRIMIN) 1 % vaginal cream Place 1 Applicatorful vaginally at bedtime. 45 g Jakory Matsuo K, PA-C      PDMP not reviewed this encounter.   Jeani Hawking, PA-C 09/10/22 2009

## 2022-11-13 ENCOUNTER — Encounter: Payer: Self-pay | Admitting: Emergency Medicine

## 2022-11-13 ENCOUNTER — Other Ambulatory Visit: Payer: Self-pay

## 2022-11-13 ENCOUNTER — Emergency Department: Payer: Medicare Other

## 2022-11-13 ENCOUNTER — Emergency Department
Admission: EM | Admit: 2022-11-13 | Discharge: 2022-11-13 | Disposition: A | Discharge status: Home / Self Care | Payer: Medicare Other | Attending: Emergency Medicine | Admitting: Emergency Medicine

## 2022-11-13 DIAGNOSIS — Z1152 Encounter for screening for COVID-19: Secondary | ICD-10-CM | POA: Insufficient documentation

## 2022-11-13 DIAGNOSIS — I2510 Atherosclerotic heart disease of native coronary artery without angina pectoris: Secondary | ICD-10-CM | POA: Diagnosis not present

## 2022-11-13 DIAGNOSIS — R531 Weakness: Secondary | ICD-10-CM | POA: Insufficient documentation

## 2022-11-13 DIAGNOSIS — E119 Type 2 diabetes mellitus without complications: Secondary | ICD-10-CM | POA: Diagnosis not present

## 2022-11-13 DIAGNOSIS — I10 Essential (primary) hypertension: Secondary | ICD-10-CM | POA: Insufficient documentation

## 2022-11-13 DIAGNOSIS — R0602 Shortness of breath: Secondary | ICD-10-CM | POA: Insufficient documentation

## 2022-11-13 DIAGNOSIS — R42 Dizziness and giddiness: Secondary | ICD-10-CM | POA: Diagnosis present

## 2022-11-13 DIAGNOSIS — N3000 Acute cystitis without hematuria: Secondary | ICD-10-CM | POA: Diagnosis not present

## 2022-11-13 DIAGNOSIS — N3001 Acute cystitis with hematuria: Secondary | ICD-10-CM

## 2022-11-13 LAB — BASIC METABOLIC PANEL
Anion gap: 12 (ref 5–15)
BUN: 8 mg/dL (ref 8–23)
CO2: 22 mmol/L (ref 22–32)
Calcium: 9.5 mg/dL (ref 8.9–10.3)
Chloride: 102 mmol/L (ref 98–111)
Creatinine, Ser: 0.57 mg/dL (ref 0.44–1.00)
GFR, Estimated: >60 mL/min (ref >60)
Glucose, Bld: 213 mg/dL — ABNORMAL HIGH (ref 70–99)
Potassium: 3.9 mmol/L (ref 3.5–5.1)
Sodium: 136 mmol/L (ref 135–145)

## 2022-11-13 LAB — HEPATIC FUNCTION PANEL
ALT: 17 U/L (ref 0–44)
AST: 24 U/L (ref 15–41)
Albumin: 3.9 g/dL (ref 3.5–5.0)
Alkaline Phosphatase: 93 U/L (ref 38–126)
Bilirubin, Direct: <0.1 mg/dL (ref 0.0–0.2)
Total Bilirubin: 0.4 mg/dL (ref <1.2)
Total Protein: 7.3 g/dL (ref 6.5–8.1)

## 2022-11-13 LAB — URINALYSIS, ROUTINE W REFLEX MICROSCOPIC
Bilirubin Urine: NEGATIVE
Glucose, UA: 50 mg/dL — AB
Hgb urine dipstick: NEGATIVE
Ketones, ur: 5 mg/dL — AB
Nitrite: NEGATIVE
Protein, ur: 30 mg/dL — AB
Specific Gravity, Urine: 1.02 (ref 1.005–1.030)
pH: 8 (ref 5.0–8.0)

## 2022-11-13 LAB — RESP PANEL BY RT-PCR (RSV, FLU A&B, COVID)  RVPGX2
Influenza A by PCR: NEGATIVE
Influenza B by PCR: NEGATIVE
Resp Syncytial Virus by PCR: NEGATIVE
SARS Coronavirus 2 by RT PCR: NEGATIVE

## 2022-11-13 LAB — CBC
HCT: 40 % (ref 36.0–46.0)
Hemoglobin: 13.5 g/dL (ref 12.0–15.0)
MCH: 28.1 pg (ref 26.0–34.0)
MCHC: 33.8 g/dL (ref 30.0–36.0)
MCV: 83.3 fL (ref 80.0–100.0)
Platelets: 265 10*3/uL (ref 150–400)
RBC: 4.8 MIL/uL (ref 3.87–5.11)
RDW: 13.3 % (ref 11.5–15.5)
WBC: 9 10*3/uL (ref 4.0–10.5)
nRBC: 0 % (ref 0.0–0.2)

## 2022-11-13 LAB — LIPASE, BLOOD: Lipase: 32 U/L (ref 11–51)

## 2022-11-13 LAB — TROPONIN I (HIGH SENSITIVITY): Troponin I (High Sensitivity): 7 ng/L (ref <18)

## 2022-11-13 LAB — CBG MONITORING, ED: Glucose-Capillary: 200 mg/dL — ABNORMAL HIGH (ref 70–99)

## 2022-11-13 MED ORDER — CEPHALEXIN 500 MG PO CAPS
500.0000 mg | ORAL_CAPSULE | Freq: Once | ORAL | Status: AC
  Administered 2022-11-13: 500 mg via ORAL
  Filled 2022-11-13: qty 1

## 2022-11-13 MED ORDER — CEFUROXIME AXETIL 500 MG PO TABS: 500.0000 mg | ORAL_TABLET | Freq: Two times a day (BID) | ORAL | 0 refills | Status: AC

## 2022-11-13 NOTE — ED Notes (Signed)
See triage note. Pt unsure of having a LOC pt is A&Ox4. NAD noted.

## 2022-11-13 NOTE — ED Notes (Signed)
Pt made aware for need of urine sample. 

## 2022-11-13 NOTE — ED Provider Notes (Signed)
[facility] Provider Note    Event Date/Time   First MD Initiated Contact with Patient 11/13/22 1710     (approximate)   History   Near Syncope   HPI Haley Mooney is a 82 y.o. female with history of CAD, DM 2, HTN, prior CVA presenting today for near syncope.  Patient states over the past several days she has felt weak and lightheaded.  She feels like she is going to pass out when she stands.  She denies having any actual episodes of loss of consciousness.  She has noted some shortness of breath, brain fog as well.  Otherwise denies chest pain, abdominal pain, nausea, vomiting, diarrhea, constipation, dysuria, leg pain, leg swelling.  No injury to her head.  Reviewed most recent chart notes.     Physical Exam   Triage Vital Signs: ED Triage Vitals  Encounter Vitals Group     BP 11/13/22 1549 (!) 152/81     Systolic BP Percentile --      Diastolic BP Percentile --      Pulse Rate 11/13/22 1549 71     Resp 11/13/22 1549 20     Temp 11/13/22 1549 97.6 F (36.4 C)     Temp Source 11/13/22 1549 Oral     SpO2 11/13/22 1549 100 %     Weight 11/13/22 1551 179 lb 14.3 oz (81.6 kg)     Height 11/13/22 1551 5\' 5"  (1.651 m)     Head Circumference --      Peak Flow --      Pain Score 11/13/22 1550 6     Pain Loc --      Pain Education --      Exclude from Growth Chart --     Most recent vital signs: Vitals:   11/13/22 1549  BP: (!) 152/81  Pulse: 71  Resp: 20  Temp: 97.6 F (36.4 C)  SpO2: 100%   Physical Exam: I have reviewed the vital signs and nursing notes. General: Awake, alert, no acute distress.  Nontoxic appearing. Head:  Atraumatic, normocephalic.   ENT:  EOM intact, PERRL. Oral mucosa is pink and moist with no lesions. Neck: Neck is supple with full range of motion, No meningeal signs. Cardiovascular:  RRR, No murmurs. Peripheral pulses palpable and equal bilaterally. Respiratory:  Symmetrical chest wall expansion.  No rhonchi,  rales, or wheezes.  Good air movement throughout.  No use of accessory muscles.  Mildly tachypneic. Musculoskeletal:  No cyanosis or edema. Moving extremities with full ROM Abdomen:  Soft, nontender, nondistended. Neuro:  GCS 15, moving all four extremities, interacting appropriately. Speech clear.  Cranial nerves II through XII intact.  Sensation intact and equal bilaterally in upper and lower extremities.  5 out of 5 strength in bilateral upper and lower extremities. Psych:  Calm, appropriate.   Skin:  Warm, dry, no rash.    ED Results / Procedures / Treatments   Labs (all labs ordered are listed, but only abnormal results are displayed) Labs Reviewed  BASIC METABOLIC PANEL - Abnormal; Notable for the following components:      Result Value   Glucose, Bld 213 (*)    All other components within normal limits  URINALYSIS, ROUTINE W REFLEX MICROSCOPIC - Abnormal; Notable for the following components:   Color, Urine YELLOW (*)    APPearance HAZY (*)    Glucose, UA 50 (*)    Ketones, ur 5 (*)    Protein, ur 30 (*)  Leukocytes,Ua TRACE (*)    Bacteria, UA RARE (*)    All other components within normal limits  CBG MONITORING, ED - Abnormal; Notable for the following components:   Glucose-Capillary 200 (*)    All other components within normal limits  RESP PANEL BY RT-PCR (RSV, FLU A&B, COVID)  [id number]  CBC  HEPATIC FUNCTION PANEL  LIPASE, BLOOD  TROPONIN I (HIGH SENSITIVITY)     EKG My EKG interpretation: Rate of 75, normal sinus rhythm, normal axis, normal intervals.  No acute ST elevations or depressions   RADIOLOGY Independent interpreted chest x-ray and CT scan with no acute pathology   PROCEDURES:  Critical Care performed: No  Procedures   MEDICATIONS ORDERED IN ED: Medications  cephALEXin (KEFLEX) capsule 500 mg (has no administration in time range)     IMPRESSION / MDM / ASSESSMENT AND PLAN / ED COURSE  I reviewed the triage vital signs and the nursing  notes.                              Differential diagnosis includes, but is not limited to, dehydration, urinary tract infection, less likely cardiac arrhythmia, low suspicion for CVA  Patient's presentation is most consistent with acute complicated illness / injury requiring diagnostic workup.  Patient is an 82 year old female presenting today for intermittent lightheadedness and weakness over the past several days.  Otherwise denies any other acute symptoms at this time.  Neurological examination unremarkable.  Vital signs are stable.  Will evaluate for dehydration versus urinary tract infection or other source of infection.  CBC, BMP unremarkable.  Troponin unremarkable with no chest pain.  EKG reassuring.  Lipase and hepatic function normal.  Respiratory panel negative.  CT head and chest x-ray also showed no acute pathology.  Her UA does appear equivocal for UTI at this time.  Given some of her associated symptoms with bacteria in the urine, will empirically treat as a UTI at this time.  Updated patient's daughter over the phone who is agreeable with this plan as well.  Patient is safe for discharge and follow-up with her PCP.  Given strict return precautions.  The patient is on the cardiac monitor to evaluate for evidence of arrhythmia and/or significant heart rate changes. Clinical Course as of 11/13/22 2029  Wed Nov 13, 2022  1941 Urinalysis, Routine w reflex microscopic -Urine, Clean Catch(!) Equivocal for UTI [DW]    Clinical Course User Index [DW] Janith Lima, MD     FINAL CLINICAL IMPRESSION(S) / ED DIAGNOSES   Final diagnoses:  Acute cystitis with hematuria  Weakness     Rx / DC Orders   ED Discharge Orders          Ordered    cefUROXime (CEFTIN) 500 MG tablet  2 times daily with meals        11/13/22 2024             Note:  This document was prepared using Dragon voice recognition software and may include unintentional dictation errors.   Janith Lima,  MD 11/13/22 2031

## 2022-11-13 NOTE — ED Notes (Signed)
Pt calling out asking why she is still here. Pt informed that we are waiting on a room for her to see the doctor. Pt c/o being cold. Pt was given warm blankets.

## 2022-11-13 NOTE — Discharge Instructions (Addendum)
Antibiotics have been sent to your pharmacy for you to pick up and take as prescribed.  Please follow-up with your primary care provider as needed.  Please return for any worsening symptoms.  Maintain adequate hydration at home to help with the weakness symptoms.

## 2022-11-13 NOTE — ED Notes (Signed)
Lab called to add trop on to existing specimens.

## 2022-11-13 NOTE — ED Triage Notes (Signed)
BIB EMS from home. Pt states has felt like she was going to pass out for the past 3 days. Denies any falls. Has also been feeling constipated and did a fleets enema yesterday.   EMS Vitals: 150/100 HR 80 CBG 230

## 2022-11-13 NOTE — ED Notes (Signed)
Pt stated numerous times that she does not want her daughter Olegario Messier or son in law Paradise contacted as they are on a cruise and will return in a few days. She asked that if any family is needed she has another daughter in [place] that can be contacted.

## 2023-11-10 ENCOUNTER — Other Ambulatory Visit: Payer: Self-pay

## 2023-11-10 ENCOUNTER — Emergency Department
Admission: EM | Admit: 2023-11-10 | Discharge: 2023-11-11 | Disposition: A | Discharge status: Home / Self Care | Attending: Emergency Medicine | Admitting: Emergency Medicine

## 2023-11-10 DIAGNOSIS — W1839XA Other fall on same level, initial encounter: Secondary | ICD-10-CM | POA: Insufficient documentation

## 2023-11-10 DIAGNOSIS — H10402 Unspecified chronic conjunctivitis, left eye: Secondary | ICD-10-CM | POA: Diagnosis not present

## 2023-11-10 DIAGNOSIS — R4182 Altered mental status, unspecified: Secondary | ICD-10-CM | POA: Diagnosis present

## 2023-11-10 DIAGNOSIS — E119 Type 2 diabetes mellitus without complications: Secondary | ICD-10-CM | POA: Insufficient documentation

## 2023-11-10 DIAGNOSIS — I10 Essential (primary) hypertension: Secondary | ICD-10-CM | POA: Diagnosis not present

## 2023-11-10 DIAGNOSIS — W19XXXA Unspecified fall, initial encounter: Secondary | ICD-10-CM

## 2023-11-10 DIAGNOSIS — I2510 Atherosclerotic heart disease of native coronary artery without angina pectoris: Secondary | ICD-10-CM | POA: Insufficient documentation

## 2023-11-10 NOTE — ED Triage Notes (Signed)
 PT BIB [facility] from [facility] for altered mental status and fall. EMS reports that around 2200 this evening pt had an unwitnessed fall. Pt not on blood thinners. Pt states that she remembers the fall unsure if she hit her head and states she was unable to get up after the fall. PT Aox3 upon arrival to the ED. Pt c/o neck pain x3 days and eye pain x 5 days.

## 2023-11-11 ENCOUNTER — Emergency Department

## 2023-11-11 DIAGNOSIS — R4182 Altered mental status, unspecified: Secondary | ICD-10-CM | POA: Diagnosis not present

## 2023-11-11 LAB — URINALYSIS, ROUTINE W REFLEX MICROSCOPIC
Bacteria, UA: NONE SEEN
Bilirubin Urine: NEGATIVE
Glucose, UA: NEGATIVE mg/dL
Hgb urine dipstick: NEGATIVE
Ketones, ur: NEGATIVE mg/dL
Leukocytes,Ua: NEGATIVE
Nitrite: NEGATIVE
Protein, ur: 30 mg/dL — AB
Specific Gravity, Urine: 1.02 (ref 1.005–1.030)
pH: 5 (ref 5.0–8.0)

## 2023-11-11 LAB — COMPREHENSIVE METABOLIC PANEL WITH GFR
ALT: 12 U/L (ref 0–44)
AST: 17 U/L (ref 15–41)
Albumin: 3.3 g/dL — ABNORMAL LOW (ref 3.5–5.0)
Alkaline Phosphatase: 73 U/L (ref 38–126)
Anion gap: 10 (ref 5–15)
BUN: 18 mg/dL (ref 8–23)
CO2: 23 mmol/L (ref 22–32)
Calcium: 8.9 mg/dL (ref 8.9–10.3)
Chloride: 106 mmol/L (ref 98–111)
Creatinine, Ser: 0.8 mg/dL (ref 0.44–1.00)
GFR, Estimated: >60 mL/min (ref >60)
Glucose, Bld: 158 mg/dL — ABNORMAL HIGH (ref 70–99)
Potassium: 3 mmol/L — ABNORMAL LOW (ref 3.5–5.1)
Sodium: 139 mmol/L (ref 135–145)
Total Bilirubin: 0.6 mg/dL (ref 0.0–1.2)
Total Protein: 6.6 g/dL (ref 6.5–8.1)

## 2023-11-11 LAB — CBC
HCT: 33.5 % — ABNORMAL LOW (ref 36.0–46.0)
Hemoglobin: 11.2 g/dL — ABNORMAL LOW (ref 12.0–15.0)
MCH: 28.8 pg (ref 26.0–34.0)
MCHC: 33.4 g/dL (ref 30.0–36.0)
MCV: 86.1 fL (ref 80.0–100.0)
Platelets: 165 K/uL (ref 150–400)
RBC: 3.89 MIL/uL (ref 3.87–5.11)
RDW: 13 % (ref 11.5–15.5)
WBC: 8.1 K/uL (ref 4.0–10.5)
nRBC: 0 % (ref 0.0–0.2)

## 2023-11-11 MED ORDER — POLYMYXIN B-TRIMETHOPRIM 10000-0.1 UNIT/ML-% OP SOLN: 1.0000 [drp] | Freq: Four times a day (QID) | OPHTHALMIC | 0 refills | Status: AC

## 2023-11-11 NOTE — ED Notes (Signed)
 PT in no acute distress prior to discharge. Discharged instructions reviewed, all questions answered and pt facility verbalized understanding at this time. Pt has all belongings with them at time of discharge.

## 2023-11-11 NOTE — ED Provider Notes (Addendum)
 [facility] Provider Note    Event Date/Time   First MD Initiated Contact with Patient 11/10/23 2307     (approximate)   History   Altered Mental Status   HPI  Haley Mooney is a 83 y.o. female   Past medical history of bipolar, CAD, cancer, diabetes, hypertension who presents to Emergency Department with a fall.  She walks with a walker, was using her walker, transition to try to sit in his seat and missed the seat falling down onto the ground.  No significant injury per patient report.  She reports she is otherwise in her regular state of health with no acute illnesses or medical complaints recently.  She had no presyncopal symptoms.  Has a previous history of some form of neck surgery and chronic pain related to that, not acutely worsened.   External Medical Documents Reviewed: Previous outpatient notes.  Ophthalmology note from August noting chronic conjunctivitis OS and blepharitis OS      Physical Exam   Triage Vital Signs: ED Triage Vitals  Encounter Vitals Group     BP 11/10/23 2310 (!) 145/65     Girls Systolic BP Percentile --      Girls Diastolic BP Percentile --      Boys Systolic BP Percentile --      Boys Diastolic BP Percentile --      Pulse Rate 11/10/23 2310 (!) 53     Resp 11/10/23 2310 17     Temp 11/11/23 0001 98.7 F (37.1 C)     Temp Source 11/11/23 0001 Oral     SpO2 11/10/23 2306 99 %     Weight --      Height 11/10/23 2310 5' 5 (1.651 m)     Head Circumference --      Peak Flow --      Pain Score 11/10/23 2310 3     Pain Loc --      Pain Education --      Exclude from Growth Chart --     Most recent vital signs: Vitals:   11/11/23 0030 11/11/23 0100  BP: (!) 121/59 (!) 147/77  Pulse: 63 60  Resp: (!) 23 (!) 23  Temp:    SpO2: 100% 100%    General: Awake, no distress.  CV:  Good peripheral perfusion.  Resp:  Normal effort.  Abd:  No distention.  Other:  Awake alert oriented pleasant patient  answering my questions appropriately.  Slight hypertension otherwise vital signs within normal limits.  No obvious signs of head trauma, mild tenderness to midline C-spine without deformity.  Neck with full range of motion.  Ranging all extremities with full active range of motion.  No significant tenderness to palpation or external signs of injury to the thorax, back, abdomen or extremities.   ED Results / Procedures / Treatments   Labs (all labs ordered are listed, but only abnormal results are displayed) Labs Reviewed  COMPREHENSIVE METABOLIC PANEL WITH GFR - Abnormal; Notable for the following components:      Result Value   Potassium 3.0 (*)    Glucose, Bld 158 (*)    Albumin 3.3 (*)    All other components within normal limits  CBC - Abnormal; Notable for the following components:   Hemoglobin 11.2 (*)    HCT 33.5 (*)    All other components within normal limits  URINALYSIS, ROUTINE W REFLEX MICROSCOPIC - Abnormal; Notable for the following components:   Color, Urine  YELLOW (*)    APPearance HAZY (*)    Protein, ur 30 (*)    All other components within normal limits     I ordered and reviewed the above labs they are notable for cell counts electrolytes and urinalysis largely unremarkable.  EKG  ED ECG REPORT I, Ginnie Shams, the attending physician, personally viewed and interpreted this ECG.   Date: 11/11/2023  EKG Time: 2307  Rate: 57  Rhythm: sinus   Axis: nl  Intervals:nl  ST&T Change: no stemi    RADIOLOGY I independently reviewed and interpreted pelvis x-ray and see no obvious fracture or dislocation I also reviewed radiologist's formal read.   PROCEDURES:  Critical Care performed: No  Procedures   MEDICATIONS ORDERED IN ED: Medications - No data to display  IMPRESSION / MDM / ASSESSMENT AND PLAN / ED COURSE  I reviewed the triage vital signs and the nursing notes.                                Patient's presentation is most consistent with  acute presentation with potential threat to life or bodily function.  Differential diagnosis includes, but is not limited to, fall leading to blunt traumatic injury of the head, neck, chest, or pelvis.  Considered but less likely syncopal episode or organic cause of fall.  Chronic appearing conjunctivitis.   MDM:    Mechanical slip and fall today with no obvious signs of injury nor reported by patient.  Given her age and comorbidities, high risk for fractures, ICH, C-spine injury so we will proceed with imaging of the head, neck, chest x-ray and pelvis.  Check basic labs urinalysis unremarkable.  EKG with no malignant dysrhythmias or ischemic changes.  No other acute medical complaints by patient, defer further medical workup.  Chronic appearing left-sided conjunctivitis consistent with August ophthalmology note will prescribe Polytrim and have her follow-up with eye doctor.  No signs of deeper infection, no fever, no pain with EOM or proptosis.  I considered hospitalization for admission or observation however given largely unremarkable workup as above,  patient will be discharged with outpatient follow-up        FINAL CLINICAL IMPRESSION(S) / ED DIAGNOSES   Final diagnoses:  Chronic conjunctivitis of left eye, unspecified chronic conjunctivitis type  Fall, initial encounter     Rx / DC Orders   ED Discharge Orders          Ordered    trimethoprim -polymyxin b (POLYTRIM) ophthalmic solution  Every 6 hours        11/11/23 0056             Note:  This document was prepared using Dragon voice recognition software and may include unintentional dictation errors.    Shams Ginnie, MD 11/11/23 9940    Shams Ginnie, MD 11/11/23 [phone]

## 2023-11-11 NOTE — Discharge Instructions (Addendum)
 Fortunately your evaluation in the emergency department did not show any significant injuries that would require hospitalization or surgery at this time.  Use the eyedrops for suspected eye infection as prescribed.  Follow-up with your eye doctor.  Thank you for choosing us  for your health care today!  Please see your primary doctor this week for a follow up appointment.   If you have any new, worsening, or unexpected symptoms call your doctor right away or come back to the emergency department for reevaluation.  It was my pleasure to care for you today.   Ginnie EDISON Cyrena, MD

## 2023-12-30 ENCOUNTER — Other Ambulatory Visit: Payer: Self-pay

## 2023-12-30 ENCOUNTER — Emergency Department

## 2023-12-30 ENCOUNTER — Inpatient Hospital Stay
Admission: EM | Admit: 2023-12-30 | Discharge: 2024-01-05 | DRG: 683 | Disposition: A | Discharge status: Skilled Nursing Facility | Source: Skilled Nursing Facility | Attending: Internal Medicine | Admitting: Internal Medicine

## 2023-12-30 DIAGNOSIS — Z955 Presence of coronary angioplasty implant and graft: Secondary | ICD-10-CM

## 2023-12-30 DIAGNOSIS — R5381 Other malaise: Secondary | ICD-10-CM | POA: Diagnosis present

## 2023-12-30 DIAGNOSIS — I959 Hypotension, unspecified: Secondary | ICD-10-CM | POA: Diagnosis present

## 2023-12-30 DIAGNOSIS — K219 Gastro-esophageal reflux disease without esophagitis: Secondary | ICD-10-CM | POA: Diagnosis present

## 2023-12-30 DIAGNOSIS — I10 Essential (primary) hypertension: Secondary | ICD-10-CM | POA: Diagnosis present

## 2023-12-30 DIAGNOSIS — Z8673 Personal history of transient ischemic attack (TIA), and cerebral infarction without residual deficits: Secondary | ICD-10-CM

## 2023-12-30 DIAGNOSIS — E1140 Type 2 diabetes mellitus with diabetic neuropathy, unspecified: Secondary | ICD-10-CM | POA: Diagnosis present

## 2023-12-30 DIAGNOSIS — F319 Bipolar disorder, unspecified: Secondary | ICD-10-CM | POA: Diagnosis present

## 2023-12-30 DIAGNOSIS — E785 Hyperlipidemia, unspecified: Secondary | ICD-10-CM | POA: Diagnosis present

## 2023-12-30 DIAGNOSIS — R410 Disorientation, unspecified: Secondary | ICD-10-CM | POA: Diagnosis present

## 2023-12-30 DIAGNOSIS — F411 Generalized anxiety disorder: Secondary | ICD-10-CM | POA: Diagnosis present

## 2023-12-30 DIAGNOSIS — D696 Thrombocytopenia, unspecified: Secondary | ICD-10-CM | POA: Diagnosis present

## 2023-12-30 DIAGNOSIS — Z79899 Other long term (current) drug therapy: Secondary | ICD-10-CM

## 2023-12-30 DIAGNOSIS — Z7985 Long-term (current) use of injectable non-insulin antidiabetic drugs: Secondary | ICD-10-CM

## 2023-12-30 DIAGNOSIS — Z90710 Acquired absence of both cervix and uterus: Secondary | ICD-10-CM

## 2023-12-30 DIAGNOSIS — G4730 Sleep apnea, unspecified: Secondary | ICD-10-CM | POA: Diagnosis present

## 2023-12-30 DIAGNOSIS — Z794 Long term (current) use of insulin: Secondary | ICD-10-CM

## 2023-12-30 DIAGNOSIS — G8929 Other chronic pain: Secondary | ICD-10-CM | POA: Diagnosis present

## 2023-12-30 DIAGNOSIS — Z7902 Long term (current) use of antithrombotics/antiplatelets: Secondary | ICD-10-CM

## 2023-12-30 DIAGNOSIS — E871 Hypo-osmolality and hyponatremia: Secondary | ICD-10-CM | POA: Diagnosis present

## 2023-12-30 DIAGNOSIS — I2510 Atherosclerotic heart disease of native coronary artery without angina pectoris: Secondary | ICD-10-CM | POA: Diagnosis present

## 2023-12-30 DIAGNOSIS — Z8249 Family history of ischemic heart disease and other diseases of the circulatory system: Secondary | ICD-10-CM

## 2023-12-30 DIAGNOSIS — N390 Urinary tract infection, site not specified: Principal | ICD-10-CM | POA: Diagnosis present

## 2023-12-30 DIAGNOSIS — B961 Klebsiella pneumoniae [K. pneumoniae] as the cause of diseases classified elsewhere: Secondary | ICD-10-CM | POA: Diagnosis present

## 2023-12-30 DIAGNOSIS — Z888 Allergy status to other drugs, medicaments and biological substances status: Secondary | ICD-10-CM

## 2023-12-30 DIAGNOSIS — Z720 Tobacco use: Secondary | ICD-10-CM

## 2023-12-30 DIAGNOSIS — N179 Acute kidney failure, unspecified: Principal | ICD-10-CM | POA: Diagnosis present

## 2023-12-30 DIAGNOSIS — Z881 Allergy status to other antibiotic agents status: Secondary | ICD-10-CM

## 2023-12-30 DIAGNOSIS — R7881 Bacteremia: Secondary | ICD-10-CM | POA: Diagnosis present

## 2023-12-30 DIAGNOSIS — E876 Hypokalemia: Secondary | ICD-10-CM | POA: Diagnosis present

## 2023-12-30 DIAGNOSIS — Z66 Do not resuscitate: Secondary | ICD-10-CM | POA: Diagnosis present

## 2023-12-30 LAB — COMPREHENSIVE METABOLIC PANEL WITH GFR
ALT: 24 U/L (ref 0–44)
AST: 27 U/L (ref 15–41)
Albumin: 3.3 g/dL — ABNORMAL LOW (ref 3.5–5.0)
Alkaline Phosphatase: 147 U/L — ABNORMAL HIGH (ref 38–126)
Anion gap: 14 (ref 5–15)
BUN: 61 mg/dL — ABNORMAL HIGH (ref 8–23)
CO2: 22 mmol/L (ref 22–32)
Calcium: 9 mg/dL (ref 8.9–10.3)
Chloride: 97 mmol/L — ABNORMAL LOW (ref 98–111)
Creatinine, Ser: 2.32 mg/dL — ABNORMAL HIGH (ref 0.44–1.00)
GFR, Estimated: 21 mL/min — ABNORMAL LOW
Glucose, Bld: 112 mg/dL — ABNORMAL HIGH (ref 70–99)
Potassium: 3.5 mmol/L (ref 3.5–5.1)
Sodium: 133 mmol/L — ABNORMAL LOW (ref 135–145)
Total Bilirubin: 0.8 mg/dL (ref 0.0–1.2)
Total Protein: 6.4 g/dL — ABNORMAL LOW (ref 6.5–8.1)

## 2023-12-30 LAB — CBC
HCT: 32.8 % — ABNORMAL LOW (ref 36.0–46.0)
Hemoglobin: 10.9 g/dL — ABNORMAL LOW (ref 12.0–15.0)
MCH: 28.1 pg (ref 26.0–34.0)
MCHC: 33.2 g/dL (ref 30.0–36.0)
MCV: 84.5 fL (ref 80.0–100.0)
Platelets: 100 K/uL — ABNORMAL LOW (ref 150–400)
RBC: 3.88 MIL/uL (ref 3.87–5.11)
RDW: 13.2 % (ref 11.5–15.5)
WBC: 10.5 K/uL (ref 4.0–10.5)
nRBC: 0 % (ref 0.0–0.2)

## 2023-12-30 LAB — URINALYSIS, ROUTINE W REFLEX MICROSCOPIC
Bilirubin Urine: NEGATIVE
Glucose, UA: NEGATIVE mg/dL
Ketones, ur: NEGATIVE mg/dL
Nitrite: NEGATIVE
Protein, ur: 100 mg/dL — AB
Specific Gravity, Urine: 1.016 (ref 1.005–1.030)
WBC, UA: >50 WBC/hpf (ref 0–5)
pH: 5 (ref 5.0–8.0)

## 2023-12-30 LAB — LACTIC ACID, PLASMA: Lactic Acid, Venous: 1.5 mmol/L (ref 0.5–1.9)

## 2023-12-30 LAB — MAGNESIUM: Magnesium: 2.2 mg/dL (ref 1.7–2.4)

## 2023-12-30 MED ORDER — POLYVINYL ALCOHOL 1.4 % OP SOLN
1.0000 [drp] | OPHTHALMIC | Status: DC | PRN
  Filled 2023-12-30: qty 15

## 2023-12-30 MED ORDER — MELATONIN 5 MG PO TABS
5.0000 mg | ORAL_TABLET | Freq: Every day | ORAL | Status: DC
  Administered 2023-12-30 – 2024-01-04 (×6): 5 mg via ORAL
  Filled 2023-12-30 (×6): qty 1

## 2023-12-30 MED ORDER — ONDANSETRON HCL 4 MG PO TABS: 4.0000 mg | ORAL_TABLET | Freq: Four times a day (QID) | ORAL | Status: DC | PRN

## 2023-12-30 MED ORDER — POLYVINYL ALCOHOL 1.4 % OP SOLN
1.0000 [drp] | Freq: Two times a day (BID) | OPHTHALMIC | Status: DC
  Administered 2023-12-30 – 2024-01-05 (×12): 1 [drp] via OPHTHALMIC
  Filled 2023-12-30 (×2): qty 15

## 2023-12-30 MED ORDER — ONDANSETRON HCL 4 MG/2ML IJ SOLN: 4.0000 mg | Freq: Four times a day (QID) | INTRAMUSCULAR | Status: DC | PRN

## 2023-12-30 MED ORDER — SODIUM CHLORIDE 0.9% FLUSH
3.0000 mL | Freq: Two times a day (BID) | INTRAVENOUS | Status: DC
  Administered 2023-12-30 – 2024-01-05 (×11): 3 mL via INTRAVENOUS

## 2023-12-30 MED ORDER — LACTATED RINGERS IV BOLUS
1000.0000 mL | Freq: Once | INTRAVENOUS | Status: AC
  Administered 2023-12-30: 1000 mL via INTRAVENOUS

## 2023-12-30 MED ORDER — HEPARIN SODIUM (PORCINE) 5000 UNIT/ML IJ SOLN
5000.0000 [IU] | Freq: Three times a day (TID) | INTRAMUSCULAR | Status: DC
  Administered 2023-12-30 – 2024-01-04 (×16): 5000 [IU] via SUBCUTANEOUS
  Filled 2023-12-30 (×16): qty 1

## 2023-12-30 MED ORDER — SODIUM CHLORIDE 0.9 % IV SOLN
1.0000 g | Freq: Once | INTRAVENOUS | Status: AC
  Administered 2023-12-30: 1 g via INTRAVENOUS
  Filled 2023-12-30: qty 10

## 2023-12-30 MED ORDER — SENNOSIDES-DOCUSATE SODIUM 8.6-50 MG PO TABS: 1.0000 | ORAL_TABLET | Freq: Every evening | ORAL | Status: DC | PRN

## 2023-12-30 MED ORDER — LACTATED RINGERS IV SOLN: INTRAVENOUS | Status: AC

## 2023-12-30 MED ORDER — ACETAMINOPHEN 650 MG RE SUPP: 650.0000 mg | Freq: Four times a day (QID) | RECTAL | Status: DC | PRN

## 2023-12-30 MED ORDER — ACETAMINOPHEN 325 MG PO TABS
650.0000 mg | ORAL_TABLET | Freq: Four times a day (QID) | ORAL | Status: DC | PRN
  Administered 2024-01-04: 650 mg via ORAL
  Filled 2023-12-30: qty 2

## 2023-12-30 NOTE — ED Provider Notes (Signed)
 "  [facility] Provider Note    Event Date/Time   First MD Initiated Contact with Patient 12/30/23 1814     (approximate)   History   Altered Mental Status   HPI  Haley Mooney is a 83 y.o. female who presents to the emergency department today from her living facility because of concerns for low blood pressure as well as some increased confusion.  Patient states that she did have a fall a few days ago with some left shoulder pain.  The patient also states that she is felt off.  She has a hard time describing exactly what she means by this.     Physical Exam   Triage Vital Signs: ED Triage Vitals  Encounter Vitals Group     BP 12/30/23 1455 100/85     Girls Systolic BP Percentile --      Girls Diastolic BP Percentile --      Boys Systolic BP Percentile --      Boys Diastolic BP Percentile --      Pulse Rate 12/30/23 1455 85     Resp 12/30/23 1455 15     Temp 12/30/23 1455 98.4 F (36.9 C)     Temp Source 12/30/23 1455 Oral     SpO2 12/30/23 1455 95 %     Weight --      Height --      Head Circumference --      Peak Flow --      Pain Score 12/30/23 1445 3     Pain Loc --      Pain Education --      Exclude from Growth Chart --     Most recent vital signs: Vitals:   12/30/23 1455  BP: 100/85  Pulse: 85  Resp: 15  Temp: 98.4 F (36.9 C)  SpO2: 95%   General: Awake, alert, oriented. CV:  Good peripheral perfusion. Regular rate and rhythm. Resp:  Normal effort. Lungs clear. Abd:  No distention. Non tender.  ED Results / Procedures / Treatments   Labs (all labs ordered are listed, but only abnormal results are displayed) Labs Reviewed  COMPREHENSIVE METABOLIC PANEL WITH GFR - Abnormal; Notable for the following components:      Result Value   Sodium 133 (*)    Chloride 97 (*)    Glucose, Bld 112 (*)    BUN 61 (*)    Creatinine, Ser 2.32 (*)    Total Protein 6.4 (*)    Albumin 3.3 (*)    Alkaline Phosphatase 147 (*)    GFR,  Estimated 21 (*)    All other components within normal limits  CBC - Abnormal; Notable for the following components:   Hemoglobin 10.9 (*)    HCT 32.8 (*)    Platelets 100 (*)    All other components within normal limits  URINALYSIS, ROUTINE W REFLEX MICROSCOPIC - Abnormal; Notable for the following components:   Color, Urine AMBER (*)    APPearance CLOUDY (*)    Hgb urine dipstick MODERATE (*)    Protein, ur 100 (*)    Leukocytes,Ua LARGE (*)    Bacteria, UA MANY (*)    All other components within normal limits  CBG MONITORING, ED     EKG  I, Guadalupe Eagles, attending physician, personally viewed and interpreted this EKG  EKG Time: 1450 Rate: 85 Rhythm: normal sinus rhythm Axis: normal Intervals: qtc 428 QRS: low voltage qrs ST changes: no st elevation  Impression: abnormal EKG    RADIOLOGY I independently interpreted and visualized the CT head. My interpretation: No ICH Radiology interpretation:  IMPRESSION:  1. No CT evidence for acute intracranial abnormality.  2. Atrophy and chronic small vessel ischemic changes of the white  matter. 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    I independently interpreted and visualized the left shoulder. My interpretation: No fracture/dislocation Radiology interpretation:  IMPRESSION:  No acute osseous abnormality.      PROCEDURES:  Critical Care performed: No   MEDICATIONS ORDERED IN ED: Medications - No data to display   IMPRESSION / MDM / ASSESSMENT AND PLAN / ED COURSE  I reviewed the triage vital signs and the nursing notes.                              Differential diagnosis includes, but is not limited to, UTI, ICH, anemia, electrolyte abnormality  Patient's presentation is most consistent with acute presentation with potential threat to life or bodily function.   Patient presented to the emergency department today from living facility because of concerns for altered mental status, low blood pressure.   Patient's blood pressure is not low here.  Workup however is concerning for urinary tract infection and acute kidney injury.  Do think patient would benefit from IV antibiotics and IV fluids.  Discussed with Dr. Fernand with the hospitalist service who will evaluate for admission.   FINAL CLINICAL IMPRESSION(S) / ED DIAGNOSES   Final diagnoses:  Lower urinary tract infectious disease  AKI (acute kidney injury)      Note:  This document was prepared using Dragon voice recognition software and may include unintentional dictation errors.    Floy Roberts, MD 12/30/23 2121  "

## 2023-12-30 NOTE — ED Notes (Signed)
 Pt cleansed of urinary incontinence. Brief and new sheets applied.

## 2023-12-30 NOTE — ED Triage Notes (Addendum)
 Pt comes with c/o AMS and low BP at [facility] via EMS.   Pt states she fell a few day ago and has left shoulder pain and butt pain. Pt states blurry vision to left eye today.  Pt has hx of this back weeks ago. Pt states she just feels bad. 112/34 last bp with EMS  Pt was altered in past with EMS after being given too much medication at facility. .  CBG 175

## 2023-12-30 NOTE — H&P (Signed)
 " History and Physical    Haley Mooney FMW:[id number] DOB: [birth year] DOA: 12/30/2023  DOS: the patient was seen and examined on 12/30/2023  PCP: Agustina Greig NOVAK, MD   Patient coming from: Home  I have personally briefly reviewed patient's old medical records in [facility] and CareEverywhere  HPI:   Haley Mooney is a 83 y.o. year old female with medical history of hypertension, hyperlipidemia, type 2 diabetes, GERD, bipolar disorder presenting to the ED from nursing facility for confusion and hypotension.  Per facility pt was confused this morning 9 am.  They stated she was lapsed in her bed but did not have a fall.  This prompted him to calling EMS and they noted her blood pressure was low which resulted in her coming to the ED.  Over the last few days she has not had any complaints.  Pt states she has not been eating well over the last 4 days. She just feels like she has no appetite. No n/v. States has malaise. No dysuria. Has increased frequency.    On arrival to the ED patient was noted to be HDS stable. Lab work obtained.  CBC without leukocytosis, mild anemia near her baseline, mild thrombocytopenia.  CMP with mild hyponatremia, normal potassium, significant elevation in her renal function consistent with AKI.  Mild decrease in her protein and albumin levels and slight elevation in her ALP levels.  UA consistent with UTI.  Lactic acid normal.  CT head without any acute findings but did show chronic ischemic changes with chronic lacunar infarct.  TRH contacted for admission.  Review of Systems: As mentioned in the history of present illness. All other systems reviewed and are negative.   Past Medical History:  Diagnosis Date   Anxiety    Arthritis    Bipolar disorder (HCC)    CAD (coronary artery disease)    Cancer (HCC)    SKIN   Depression    Diabetes mellitus without complication (HCC)    GERD (gastroesophageal reflux disease)    H/O   Heart murmur    Hypertension     Neuromuscular disorder (HCC)    NEUROPATHY   Pneumonia    IN PAST   Shortness of breath dyspnea    Sleep apnea    CPAP   Stroke (HCC)    Wears dentures    full upper, partial lower    Past Surgical History:  Procedure Laterality Date   ABDOMINAL HYSTERECTOMY     APPENDECTOMY     BACK SURGERY     BLADDER SUSPENSION     BROW LIFT Bilateral 12/16/2019   Procedure: BLEPHAROPLASTY UPPER EYELID; W/EXCESS SKIN BROW PTOSIS REPAIR BILATERAL DIABETIC;  Surgeon: Ashley Greig HERO, MD;  Location: [facility];  Service: Ophthalmology;  Laterality: Bilateral;  Diabetic - insulin  and oral meds   CATARACT EXTRACTION Left    CATARACT EXTRACTION W/PHACO Left 08/08/2015   Procedure: CATARACT EXTRACTION PHACO AND INTRAOCULAR LENS PLACEMENT (IOC);  Surgeon: Elsie Carmine, MD;  Location: [facility];  Service: Ophthalmology;  Laterality: Left;  US  00:55 AP% 17.1 CDE 9.48 fluid pack lot # [id number]   CATARACT EXTRACTION W/PHACO Right 09/05/2015   Procedure: CATARACT EXTRACTION PHACO AND INTRAOCULAR LENS PLACEMENT (IOC);  Surgeon: Elsie Carmine, MD;  Location: [facility];  Service: Ophthalmology;  Laterality: Right;  Lot# [id number] US :00:43.0 AP%: 20.3 CDE: 8.74   COLONOSCOPY     CORONARY ANGIOPLASTY WITH STENT PLACEMENT     CATH DONE [facility] 6/17 DR VINA  MILLER   EYE SURGERY     JOINT REPLACEMENT     TONSILLECTOMY       Allergies[1]  Family History  Problem Relation Age of Onset   Heart failure Father     Prior to Admission medications  Medication Sig Start Date End Date Taking? Authorizing Provider  LANTUS SOLOSTAR 100 UNIT/ML Solostar Pen Inject into the skin. 12/22/23  Yes [provider]  OZEMPIC, 1 MG/DOSE, 4 MG/3ML SOPN  12/03/23  Yes [provider]  amLODipine  (NORVASC ) 10 MG tablet Take 1 tablet (10 mg total) by mouth daily. Patient taking differently: Take 5 mg by mouth daily. 08/26/17   Vachhani, Vaibhavkumar, MD  ARIPiprazole  (ABILIFY ) 5 MG tablet Take 2.5  mg by mouth daily.  Patient not taking: Reported on 12/13/2019    [provider]  atorvastatin  (LIPITOR) 40 MG tablet Take 40 mg by mouth daily.    [provider]  atorvastatin  (LIPITOR) 80 MG tablet Take 80 mg by mouth daily at 6 PM.    [provider]  clonazePAM  (KLONOPIN ) 0.5 MG tablet Take 0.5 mg by mouth 3 (three) times daily as needed for anxiety. Patient not taking: Reported on 12/13/2019    [provider]  clopidogrel  (PLAVIX ) 75 MG tablet Take 1 tablet (75 mg total) by mouth daily. 08/26/17   Vachhani, Vaibhavkumar, MD  clotrimazole  (GYNE-LOTRIMIN ) 1 % vaginal cream Place 1 Applicatorful vaginally at bedtime. 09/10/22   Raspet, Erin K, PA-C  erythromycin  ophthalmic ointment Apply to sutures 4 times a day for 10-12 days.  Discontinue if allergy develops and call our office 12/16/19   Ashley Greig HERO, MD  gabapentin  (NEURONTIN ) 300 MG capsule Take 1 capsule by mouth daily. 07/15/17   [provider]  Garcinia Cambogia-Chromium 500-200 MG-MCG TABS Take 1 tablet by mouth 2 (two) times daily. Patient not taking: Reported on 12/13/2019    [provider]  hydrochlorothiazide  (HYDRODIURIL ) 25 MG tablet Take 25 mg by mouth daily.    [provider]  insulin  detemir (LEVEMIR ) 100 UNIT/ML injection Inject 20 Units into the skin 2 (two) times daily.     [provider]  meclizine  (ANTIVERT ) 12.5 MG tablet Take 1 tablet (12.5 mg total) by mouth 3 (three) times daily. Patient not taking: Reported on 12/13/2019 08/25/17   Vachhani, Vaibhavkumar, MD  Melatonin 10 MG CAPS Take 10 mg by mouth at bedtime. Patient not taking: Reported on 12/13/2019    [provider]  metFORMIN  (GLUMETZA ) 1000 MG (MOD) 24 hr tablet Take 1,000 mg by mouth 2 (two) times daily with a meal.     [provider]  metoprolol  succinate (TOPROL -XL) 25 MG 24 hr tablet Take 25 mg by mouth daily. Take with or immediately following a meal.    [provider]  nitroGLYCERIN  (NITROSTAT ) 0.4 MG SL tablet Place 0.4 mg under the tongue every 5 (five) minutes as needed for chest pain.    [provider]  nystatin -triamcinolone  (MYCOLOG II) cream Apply to affected area daily 12/23/15   Merilee Andrea CROME, NP  Olopatadine  HCl 0.2 % SOLN 1 drop to affected eye daily. 05/26/16   Cook, Jayce G, DO  OZEMPIC, 0.25 OR 0.5 MG/DOSE, 2 MG/3ML SOPN Inject 0.5 mg into the skin once a week.    [provider]  QUEtiapine  (SEROQUEL ) 25 MG tablet Take 25 mg by mouth at bedtime.     [provider]  traMADol  (ULTRAM ) 50 MG tablet Take 1 every 4-6 hours  as needed for pain not controlled by Tylenol  12/16/19   Ashley Greig HERO, MD  venlafaxine  XR (EFFEXOR -XR) 37.5 MG 24 hr capsule Take by mouth. Patient not taking: Reported on 12/30/2023    [provider]  venlafaxine  XR (EFFEXOR -XR) 75 MG 24 hr capsule Take 225 mg by mouth daily.    [provider]    Social History:  reports that she quit smoking about 14 years ago. Her smoking use included e-cigarettes. She uses smokeless tobacco. She reports current alcohol  use. She reports that she does not use drugs. Lives in memory care unit. Does not smoke, or drink. Walks without a walker.    Physical Exam: Vitals:   12/30/23 1815 12/30/23 1830 12/30/23 1900 12/30/23 1901  BP: (!) 162/94 (!) 141/129 (!) 152/80   Pulse: 91 89 94   Resp: 16 16 17    Temp:    98.3 F (36.8 C)  TempSrc:    Oral  SpO2: 98% 98% 99%     Gen: NAD HENT: NCAT CV: normal heart sounds Lung: CTAB Abd: No TTP, normal bowel sounds MSK: No asymmetry, good bulk and tone Neuro: alert and oriented   Labs on Admission: I have personally reviewed following labs and imaging studies  CBC: Recent Labs  Lab 12/30/23 1446  WBC 10.5  HGB 10.9*  HCT 32.8*  MCV 84.5  PLT 100*   Basic Metabolic Panel: Recent Labs  Lab 12/30/23 1446  NA 133*  K 3.5  CL 97*  CO2 22  GLUCOSE 112*  BUN 61*   CREATININE 2.32*  CALCIUM  9.0   GFR: CrCl cannot be calculated (Unknown ideal weight.). Liver Function Tests: Recent Labs  Lab 12/30/23 1446  AST 27  ALT 24  ALKPHOS 147*  BILITOT 0.8  PROT 6.4*  ALBUMIN 3.3*   No results for input(s): LIPASE, AMYLASE in the last 168 hours. No results for input(s): AMMONIA in the last 168 hours. Coagulation Profile: No results for input(s): INR, PROTIME in the last 168 hours. Cardiac Enzymes: No results for input(s): CKTOTAL, CKMB, CKMBINDEX, TROPONINI, TROPONINIHS in the last 168 hours. BNP (last 3 results) No results for input(s): BNP in the last 8760 hours. HbA1C: No results for input(s): HGBA1C in the last 72 hours. CBG: No results for input(s): GLUCAP in the last 168 hours. Lipid Profile: No results for input(s): CHOL, HDL, LDLCALC, TRIG, CHOLHDL, LDLDIRECT in the last 72 hours. Thyroid Function Tests: No results for input(s): TSH, T4TOTAL, FREET4, T3FREE, THYROIDAB in the last 72 hours. Anemia Panel: No results for input(s): VITAMINB12, FOLATE, FERRITIN, TIBC, IRON, RETICCTPCT in the last 72 hours. Urine analysis:    Component Value Date/Time   COLORURINE AMBER (A) 12/30/2023 1520   APPEARANCEUR CLOUDY (A) 12/30/2023 1520   APPEARANCEUR CLOUDY 01/20/2014 1003   LABSPEC 1.016 12/30/2023 1520   LABSPEC 1.020 01/20/2014 1003   PHURINE 5.0 12/30/2023 1520   GLUCOSEU NEGATIVE 12/30/2023 1520   GLUCOSEU 500 mg/dL 98/85/7983 8996   HGBUR MODERATE (A) 12/30/2023 1520   BILIRUBINUR NEGATIVE 12/30/2023 1520   BILIRUBINUR NEGATIVE 01/20/2014 1003   KETONESUR NEGATIVE 12/30/2023 1520   PROTEINUR 100 (A) 12/30/2023 1520   NITRITE NEGATIVE 12/30/2023 1520   LEUKOCYTESUR LARGE (A) 12/30/2023 1520   LEUKOCYTESUR TRACE 01/20/2014 1003    Radiological Exams on Admission: I have personally reviewed images CT Head Wo Contrast Result Date: 12/30/2023 CLINICAL DATA:  Altered  mental status EXAM: CT HEAD WITHOUT CONTRAST TECHNIQUE: Contiguous axial images were obtained from the base of the  skull through the vertex without intravenous contrast. RADIATION DOSE REDUCTION: This exam was performed according to the departmental dose-optimization program which includes automated exposure control, adjustment of the mA and/or kV according to patient size and/or use of iterative reconstruction technique. COMPARISON:  CT 11/11/2023, 11/13/2022 FINDINGS: Brain: No acute territorial infarction, hemorrhage or intracranial mass. Atrophy and mild chronic small vessel ischemic changes of the white matter. Stable ventricle size. Chronic lacunar infarct in the pons Vascular: No hyperdense vessels. Vertebral and carotid vascular calcification Skull: Normal. Negative for fracture or focal lesion. Sinuses/Orbits: No acute finding. Other: None IMPRESSION: 1. No CT evidence for acute intracranial abnormality. 2. Atrophy and chronic small vessel ischemic changes of the white matter. 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 Electronically Signed   By: Luke Bun M.D.   On: 12/30/2023 17:42   DG Shoulder Left Result Date: 12/30/2023 CLINICAL DATA:  Fall shoulder pain EXAM: LEFT SHOULDER - 2+ VIEW COMPARISON:  None Available. FINDINGS: Moderate AC joint degenerative change. No fracture or malalignment. Calcific tendinopathy. IMPRESSION: No acute osseous abnormality. Electronically Signed   By: Luke Bun M.D.   On: 12/30/2023 17:39    EKG: My personal interpretation of EKG shows: normal sinus rhythm without any acute ST changes.     Assessment/Plan Principal Problem:   AKI (acute kidney injury) Active Problems:   Essential hypertension   Hyperlipidemia   GERD (gastroesophageal reflux disease)   GAD (generalized anxiety disorder)   Bipolar disease, chronic (HCC) Acute renal failure: Likely prerenal given BUN to creatinine ratio.  Patient getting IVF.  Bladder scan ordered.  If not downtrending  by tomorrow would recommend getting renal ultrasound.  Avoid nephrotoxic agents, renally dose meds and trend renal function.  Urinary Tract Infection Urine consistent with UTI.  Urine and blood culture pending. Pt started on IV Ceftriaxone  1 g daily.  No CVA tenderness to raise alarm for pyelonephritis. -Continue current abx therapy -Follow up cultures -Monitor fever curve and leukocytosis  Chronic Problems: Restart home meds once med reconciliation is completed.  HTN: Holding home med currently given episode of hypotension and active infection but this can be resumed if patient becomes hypertensive.  Also use IV antihypertensives as needed. HLD: continue home meds T2DM: hold home meds, start on SSI and titrate Bipolar disorder: Continue home med MDD/GAD: continue home meds Chronic pain: Continue home tramadol    VTE prophylaxis:  SQ Heparin   Diet: HH Code Status:  DNR/DNI(Do NOT Intubate) Telemetry:  Admission status: Observation, Telemetry bed Patient is from: Home Anticipated d/c is to: Home Anticipated d/c is in: 1-2 days   Family Communication: Updated at bedside  Consults called: None   Severity of Illness: The appropriate patient status for this patient is OBSERVATION. Observation status is judged to be reasonable and necessary in order to provide the required intensity of service to ensure the patient's safety. The patient's presenting symptoms, physical exam findings, and initial radiographic and laboratory data in the context of their medical condition is felt to place them at decreased risk for further clinical deterioration. Furthermore, it is anticipated that the patient will be medically stable for discharge from the hospital within 2 midnights of admission.    Morene Bathe, MD [facility]      [1]  Allergies Allergen Reactions   Ace Inhibitors Cough    Other reaction(s): OTHER   Isosorbide Other (See Comments)    Other reaction(s): Other  (See Comments)  cough   Tetracyclines & Related Rash   "

## 2023-12-30 NOTE — Telephone Encounter (Signed)
 Copied from CRM #[id number]. Topic: Access To Clinicians - Req Clinic Call Back >> Dec 30, 2023  2:25 PM Irma D wrote:  The PAC has received an incoming clinical call:  Caller name: Marci Rummer with [facility]  Best callback number: [phone]  Relationship to Patient:  Describe the reason for the call:   Mrs. Marci is calling to inform pcp clinic that patient has been admitted to [facility] [place] today 12/30/2023. She is required to inform pcp clinic of this notation.

## 2023-12-31 DIAGNOSIS — E876 Hypokalemia: Secondary | ICD-10-CM | POA: Diagnosis present

## 2023-12-31 DIAGNOSIS — R7881 Bacteremia: Secondary | ICD-10-CM | POA: Diagnosis present

## 2023-12-31 DIAGNOSIS — Z794 Long term (current) use of insulin: Secondary | ICD-10-CM | POA: Diagnosis not present

## 2023-12-31 DIAGNOSIS — F319 Bipolar disorder, unspecified: Secondary | ICD-10-CM | POA: Diagnosis present

## 2023-12-31 DIAGNOSIS — E871 Hypo-osmolality and hyponatremia: Secondary | ICD-10-CM | POA: Diagnosis present

## 2023-12-31 DIAGNOSIS — I10 Essential (primary) hypertension: Secondary | ICD-10-CM | POA: Diagnosis present

## 2023-12-31 DIAGNOSIS — D696 Thrombocytopenia, unspecified: Secondary | ICD-10-CM | POA: Diagnosis present

## 2023-12-31 DIAGNOSIS — I959 Hypotension, unspecified: Secondary | ICD-10-CM | POA: Diagnosis present

## 2023-12-31 DIAGNOSIS — N179 Acute kidney failure, unspecified: Secondary | ICD-10-CM | POA: Diagnosis present

## 2023-12-31 DIAGNOSIS — I2510 Atherosclerotic heart disease of native coronary artery without angina pectoris: Secondary | ICD-10-CM | POA: Diagnosis present

## 2023-12-31 DIAGNOSIS — Z7902 Long term (current) use of antithrombotics/antiplatelets: Secondary | ICD-10-CM | POA: Diagnosis not present

## 2023-12-31 DIAGNOSIS — E785 Hyperlipidemia, unspecified: Secondary | ICD-10-CM | POA: Diagnosis present

## 2023-12-31 DIAGNOSIS — N390 Urinary tract infection, site not specified: Secondary | ICD-10-CM | POA: Diagnosis present

## 2023-12-31 DIAGNOSIS — K219 Gastro-esophageal reflux disease without esophagitis: Secondary | ICD-10-CM | POA: Diagnosis present

## 2023-12-31 DIAGNOSIS — E1140 Type 2 diabetes mellitus with diabetic neuropathy, unspecified: Secondary | ICD-10-CM | POA: Diagnosis present

## 2023-12-31 DIAGNOSIS — R5381 Other malaise: Secondary | ICD-10-CM | POA: Diagnosis present

## 2023-12-31 DIAGNOSIS — Z955 Presence of coronary angioplasty implant and graft: Secondary | ICD-10-CM | POA: Diagnosis not present

## 2023-12-31 DIAGNOSIS — G8929 Other chronic pain: Secondary | ICD-10-CM | POA: Diagnosis present

## 2023-12-31 DIAGNOSIS — Z7985 Long-term (current) use of injectable non-insulin antidiabetic drugs: Secondary | ICD-10-CM | POA: Diagnosis not present

## 2023-12-31 DIAGNOSIS — F411 Generalized anxiety disorder: Secondary | ICD-10-CM | POA: Diagnosis present

## 2023-12-31 DIAGNOSIS — Z8249 Family history of ischemic heart disease and other diseases of the circulatory system: Secondary | ICD-10-CM | POA: Diagnosis not present

## 2023-12-31 DIAGNOSIS — Z66 Do not resuscitate: Secondary | ICD-10-CM | POA: Diagnosis present

## 2023-12-31 DIAGNOSIS — B961 Klebsiella pneumoniae [K. pneumoniae] as the cause of diseases classified elsewhere: Secondary | ICD-10-CM | POA: Diagnosis present

## 2023-12-31 DIAGNOSIS — Z8673 Personal history of transient ischemic attack (TIA), and cerebral infarction without residual deficits: Secondary | ICD-10-CM | POA: Diagnosis not present

## 2023-12-31 LAB — BLOOD CULTURE ID PANEL (REFLEXED) - BCID2

## 2023-12-31 LAB — BASIC METABOLIC PANEL WITH GFR
Anion gap: 14 (ref 5–15)
BUN: 45 mg/dL — ABNORMAL HIGH (ref 8–23)
CO2: 20 mmol/L — ABNORMAL LOW (ref 22–32)
Calcium: 8.8 mg/dL — ABNORMAL LOW (ref 8.9–10.3)
Chloride: 101 mmol/L (ref 98–111)
Creatinine, Ser: 1.36 mg/dL — ABNORMAL HIGH (ref 0.44–1.00)
GFR, Estimated: 39 mL/min — ABNORMAL LOW
Glucose, Bld: 128 mg/dL — ABNORMAL HIGH (ref 70–99)
Potassium: 3.3 mmol/L — ABNORMAL LOW (ref 3.5–5.1)
Sodium: 136 mmol/L (ref 135–145)

## 2023-12-31 LAB — HEMOGLOBIN A1C
Hgb A1c MFr Bld: 6.4 % — ABNORMAL HIGH (ref 4.8–5.6)
Mean Plasma Glucose: 136.98 mg/dL

## 2023-12-31 LAB — CBC
HCT: 30.8 % — ABNORMAL LOW (ref 36.0–46.0)
Hemoglobin: 10.7 g/dL — ABNORMAL LOW (ref 12.0–15.0)
MCH: 28.7 pg (ref 26.0–34.0)
MCHC: 34.7 g/dL (ref 30.0–36.0)
MCV: 82.6 fL (ref 80.0–100.0)
Platelets: 102 K/uL — ABNORMAL LOW (ref 150–400)
RBC: 3.73 MIL/uL — ABNORMAL LOW (ref 3.87–5.11)
RDW: 12.9 % (ref 11.5–15.5)
WBC: 10.3 K/uL (ref 4.0–10.5)
nRBC: 0 % (ref 0.0–0.2)

## 2023-12-31 LAB — GLUCOSE, CAPILLARY
Glucose-Capillary: 149 mg/dL — ABNORMAL HIGH (ref 70–99)
Glucose-Capillary: 123 mg/dL — ABNORMAL HIGH (ref 70–99)
Glucose-Capillary: 262 mg/dL — ABNORMAL HIGH (ref 70–99)
Glucose-Capillary: 149 mg/dL — ABNORMAL HIGH (ref 70–99)
Glucose-Capillary: 184 mg/dL — ABNORMAL HIGH (ref 70–99)
Glucose-Capillary: 161 mg/dL — ABNORMAL HIGH (ref 70–99)

## 2023-12-31 MED ORDER — VENLAFAXINE HCL ER 75 MG PO CP24
225.0000 mg | ORAL_CAPSULE | Freq: Every day | ORAL | Status: DC
  Administered 2023-12-31 – 2024-01-05 (×6): 225 mg via ORAL
  Filled 2023-12-31 (×6): qty 3

## 2023-12-31 MED ORDER — AMLODIPINE BESYLATE 10 MG PO TABS
10.0000 mg | ORAL_TABLET | Freq: Every day | ORAL | Status: DC
  Administered 2023-12-31 – 2024-01-05 (×6): 10 mg via ORAL
  Filled 2023-12-31 (×6): qty 1

## 2023-12-31 MED ORDER — SODIUM CHLORIDE 0.9 % IV SOLN: 1.0000 g | INTRAVENOUS | Status: DC

## 2023-12-31 MED ORDER — GABAPENTIN 300 MG PO CAPS
300.0000 mg | ORAL_CAPSULE | Freq: Every day | ORAL | Status: DC
  Administered 2023-12-31 – 2024-01-05 (×6): 300 mg via ORAL
  Filled 2023-12-31 (×6): qty 1

## 2023-12-31 MED ORDER — CLOPIDOGREL BISULFATE 75 MG PO TABS
75.0000 mg | ORAL_TABLET | Freq: Every day | ORAL | Status: DC
  Administered 2023-12-31 – 2024-01-05 (×6): 75 mg via ORAL
  Filled 2023-12-31 (×6): qty 1

## 2023-12-31 MED ORDER — POTASSIUM CHLORIDE CRYS ER 20 MEQ PO TBCR
40.0000 meq | EXTENDED_RELEASE_TABLET | Freq: Once | ORAL | Status: AC
  Administered 2023-12-31: 40 meq via ORAL
  Filled 2023-12-31: qty 2

## 2023-12-31 MED ORDER — QUETIAPINE FUMARATE 25 MG PO TABS
25.0000 mg | ORAL_TABLET | Freq: Every day | ORAL | Status: DC
  Administered 2023-12-31 – 2024-01-04 (×5): 25 mg via ORAL
  Filled 2023-12-31 (×5): qty 1

## 2023-12-31 MED ORDER — SODIUM CHLORIDE 0.9 % IV SOLN
2.0000 g | INTRAVENOUS | Status: AC
  Administered 2023-12-31 – 2024-01-02 (×3): 2 g via INTRAVENOUS
  Filled 2023-12-31 (×3): qty 20

## 2023-12-31 MED ORDER — INSULIN ASPART 100 UNIT/ML IJ SOLN
0.0000 [IU] | Freq: Three times a day (TID) | INTRAMUSCULAR | Status: DC
  Administered 2023-12-31: 1 [IU] via SUBCUTANEOUS
  Administered 2023-12-31: 5 [IU] via SUBCUTANEOUS
  Administered 2024-01-01 (×2): 2 [IU] via SUBCUTANEOUS
  Administered 2024-01-02: 3 [IU] via SUBCUTANEOUS
  Administered 2024-01-02: 2 [IU] via SUBCUTANEOUS
  Administered 2024-01-03 (×2): 3 [IU] via SUBCUTANEOUS
  Administered 2024-01-04 (×2): 2 [IU] via SUBCUTANEOUS
  Administered 2024-01-04: 5 [IU] via SUBCUTANEOUS
  Administered 2024-01-05: 2 [IU] via SUBCUTANEOUS
  Administered 2024-01-05: 3 [IU] via SUBCUTANEOUS
  Filled 2023-12-31: qty 3
  Filled 2023-12-31: qty 1
  Filled 2023-12-31: qty 2
  Filled 2023-12-31: qty 3
  Filled 2023-12-31: qty 2
  Filled 2023-12-31: qty 3
  Filled 2023-12-31: qty 2
  Filled 2023-12-31: qty 5
  Filled 2023-12-31: qty 3
  Filled 2023-12-31 (×2): qty 2
  Filled 2023-12-31: qty 3
  Filled 2023-12-31: qty 5

## 2023-12-31 MED ORDER — METOPROLOL SUCCINATE ER 25 MG PO TB24
25.0000 mg | ORAL_TABLET | Freq: Every day | ORAL | Status: DC
  Administered 2023-12-31 – 2024-01-05 (×6): 25 mg via ORAL
  Filled 2023-12-31 (×6): qty 1

## 2023-12-31 MED ORDER — HYDROCHLOROTHIAZIDE 25 MG PO TABS: 25.0000 mg | ORAL_TABLET | Freq: Every day | ORAL | Status: DC

## 2023-12-31 MED ORDER — INSULIN ASPART 100 UNIT/ML IJ SOLN
0.0000 [IU] | Freq: Every day | INTRAMUSCULAR | Status: DC
  Administered 2024-01-01: 2 [IU] via SUBCUTANEOUS
  Filled 2023-12-31: qty 2

## 2023-12-31 MED ORDER — ATORVASTATIN CALCIUM 20 MG PO TABS
40.0000 mg | ORAL_TABLET | Freq: Every day | ORAL | Status: DC
  Administered 2024-01-01 – 2024-01-05 (×5): 40 mg via ORAL
  Filled 2023-12-31 (×4): qty 2

## 2023-12-31 NOTE — Evaluation (Signed)
 Occupational Therapy Evaluation Patient Details Name: Haley Mooney MRN: [id number] DOB: 09/26/1940 Today's Date: 12/31/2023   History of Present Illness   Pt is a 83 y.o. female presented to ED with AMS and hypotension, MD assessment includes UTI and AKI. PMH significant for HTN, HLD, GERD, GAD, bipolar, UTI, skin cancer, neuropathy, stroke.     Clinical Impressions Pt admitted with above. Pt intermittently lethargic, alert to self and that it is December. Pt is a questionable historian and unable to provide accurate PLOF - chart review indicates pt from [place] and uses a RW or [id number]. Requires max encouragement to participate, MAX A for bed mobility and to don socks bed level. Pt transitions to seated EOB with mild posterior lean, reports feeling like she was going to pass out and spontaneously returns to supine. HR & SpO2 on RA WNL. BP 151/87 (108) assessed supine. RN update on pt status. Pt would benefit from skilled OT services to address noted impairments and functional limitations (see below for any additional details) in order to maximize safety and independence while minimizing falls risk and caregiver burden. Anticipate the need for follow up OT services upon acute hospital DC. Patient will benefit from continued inpatient follow up therapy, <3 hours/day      If plan is discharge home, recommend the following:   Two people to help with walking and/or transfers;Two people to help with bathing/dressing/bathroom;Direct supervision/assist for medications management;Direct supervision/assist for financial management;Assist for transportation;Supervision due to cognitive status;Help with stairs or ramp for entrance     Functional Status Assessment   Patient has had a recent decline in their functional status and demonstrates the ability to make significant improvements in function in a reasonable and predictable amount of time.     Equipment Recommendations   None recommended  by OT      Precautions/Restrictions   Precautions Precautions: Fall Recall of Precautions/Restrictions: Impaired Restrictions Weight Bearing Restrictions Per Provider Order: No     Mobility Bed Mobility Overal bed mobility: Needs Assistance Bed Mobility: Sit to Supine, Supine to Sit     Supine to sit: Max assist, Used rails, HOB elevated Sit to supine: HOB elevated, Used rails, Max assist   General bed mobility comments: max encourgement to participate, pt requires MAX A to transition from supine to sitting at EOB. Pt sits upright and immediately says I feel like I am going to pass out. Pt returned to supine, BP assessed 151/87 (108).    Transfers Overall transfer level: Needs assistance                 General transfer comment: NT      Balance Overall balance assessment: History of Falls, Needs assistance Sitting-balance support: Feet supported, Bilateral upper extremity supported Sitting balance-Leahy Scale: Fair Sitting balance - Comments: posterior lean, poor tolerance to sit upright as pt reports feeling like she was going to pass out. Returned to supine for BP assessment. Postural control: Posterior lean                                 ADL either performed or assessed with clinical judgement   ADL Overall ADL's : Needs assistance/impaired Eating/Feeding: Sitting;Supervision/ safety Eating/Feeding Details (indicate cue type and reason): able to drink with cup without difficulties                 Lower Body Dressing: Bed level;Maximal assistance Lower Body Dressing Details (  indicate cue type and reason): max A to don socks bed level             Functional mobility during ADLs: Maximal assistance General ADL Comments: will need +2 for safe mobility progression, pt intermittently lethargic, requires increased time to process.     Vision Baseline Vision/History: 1 Wears glasses Ability to See in Adequate Light: 0 Adequate               Pertinent Vitals/Pain Pain Assessment Pain Assessment: Faces Faces Pain Scale: Hurts a little bit Pain Location: HA Pain Descriptors / Indicators: Headache Pain Intervention(s): Limited activity within patient's tolerance, Premedicated before session, Monitored during session (pt declines pain meds)     Extremity/Trunk Assessment Upper Extremity Assessment Upper Extremity Assessment: Generalized weakness;Right hand dominant   Lower Extremity Assessment Lower Extremity Assessment: Generalized weakness   Cervical / Trunk Assessment Cervical / Trunk Assessment: Kyphotic   Communication Communication Communication: No apparent difficulties   Cognition Arousal: Lethargic Behavior During Therapy: Flat affect Cognition: Cognition impaired, No family/caregiver present to determine baseline   Orientation impairments: Time, Situation (knows it is December, unaware of year or why she is in hospital. States because I'm dying when asked.) Awareness: Intellectual awareness impaired Memory impairment (select all impairments): Short-term memory, Working memory Attention impairment (select first level of impairment): Focused attention Executive functioning impairment (select all impairments): Problem solving, Reasoning OT - Cognition Comments: pt lethargic, confused.                 Following commands: Impaired Following commands impaired: Follows one step commands inconsistently     Cueing  General Comments   Cueing Techniques: Verbal cues;Gestural cues;Tactile cues  HR & SpO2 on RA WNL. BP 151/87 (108) assessed supine after pt reported feeling like she was going to pass out.           Home Living Family/patient expects to be discharged to:: Assisted living (per chart, from memory care)                             Home Equipment: Agricultural Consultant (2 wheels)          Prior Functioning/Environment Prior Level of Function : Patient poor  historian/Family not available;Needs assist             Mobility Comments: pt poor historian, answers I don't know when asked about RW or SPC ADLs Comments: pt poor historian, states she is independent in ADLs including standing showers but question accuracy.    OT Problem List: Decreased strength;Decreased activity tolerance;Impaired balance (sitting and/or standing);Decreased cognition;Decreased safety awareness;Decreased knowledge of use of DME or AE;Decreased knowledge of precautions   OT Treatment/Interventions: Self-care/ADL training;Energy conservation;DME and/or AE instruction;Therapeutic activities;Patient/family education;Cognitive remediation/compensation;Balance training      OT Goals(Current goals can be found in the care plan section)   Acute Rehab OT Goals OT Goal Formulation: Patient unable to participate in goal setting Time For Goal Achievement: 01/14/24 Potential to Achieve Goals: Fair   OT Frequency:  Min 2X/week       AM-PAC OT 6 Clicks Daily Activity     Outcome Measure Help from another person eating meals?: A Little Help from another person taking care of personal grooming?: A Little Help from another person toileting, which includes using toliet, bedpan, or urinal?: Total Help from another person bathing (including washing, rinsing, drying)?: Total Help from another person to put on and taking off regular upper  body clothing?: A Lot Help from another person to put on and taking off regular lower body clothing?: Total 6 Click Score: 11   End of Session Nurse Communication: Mobility status  Activity Tolerance: Patient limited by lethargy Patient left: in bed;with call bell/phone within reach;with bed alarm set  OT Visit Diagnosis: Muscle weakness (generalized) (M62.81);History of falling (Z91.81);Other abnormalities of gait and mobility (R26.89);Unsteadiness on feet (R26.81)                Time: 9090-9061 OT Time Calculation (min): 29  min Charges:  OT General Charges $OT Visit: 1 Visit OT Evaluation $OT Eval Low Complexity: 1 Low Tavarious Freel L. Shalece Staffa, OTR/L  12/31/2023, 10:14 AM

## 2023-12-31 NOTE — Evaluation (Signed)
 Physical Therapy Evaluation Patient Details Name: Haley Mooney MRN: [id number] DOB: 08-04-1940 Today's Date: 12/31/2023  History of Present Illness  Pt is an 83 y/o F admitted on 12/30/23 after presenting with confusion & hypotension. Pt is being treated for UTI. PMH: HTN, HLD, DM2, GERD, bipolar disorder, anxiety, CAD, depression, heart murmur, neuropathy, sleep apnea, stroke  Clinical Impression  Pt seen for PT evaluation with pt received in bed, agreeable to tx. Pt pleasantly confused, oriented to self & know's Christmas is the upcoming holiday, reports she lives at [facility]. On this date, pt is able to complete bed mobility with hospital bed features & min assist, transfers with min<>mod assist. Pt tolerates standing <15 seconds, also c/o dizziness with position changes, + orthostatics. Assisted pt with meal tray set up & encouraged her to eat/drink. Will continue to follow pt acutely to progress mobility as able.  BP checked in RUE  BP  Supine  148/86 mmHg MAP 106  Sitting EOB 129/76 mmHg MAP 91  Sitting in recliner after transfer 113/75 mmHg MAP 87  Sitting in recliner, BLE elevated, end of session 154/88 mmHg MAP 108          If plan is discharge home, recommend the following: A lot of help with walking and/or transfers;A lot of help with bathing/dressing/bathroom;Assist for transportation;Assistance with cooking/housework;Help with stairs or ramp for entrance   Can travel by private vehicle   No    Equipment Recommendations Other (comment) (defer to next venue)  Recommendations for Other Services       Functional Status Assessment Patient has had a recent decline in their functional status and demonstrates the ability to make significant improvements in function in a reasonable and predictable amount of time.     Precautions / Restrictions Precautions Precautions: Fall Restrictions Weight Bearing Restrictions Per Provider Order: No      Mobility  Bed  Mobility Overal bed mobility: Needs Assistance Bed Mobility: Supine to Sit     Supine to sit: HOB elevated, Used rails, Min assist (exit L side of bed with bed rails, HOB elevated)          Transfers Overall transfer level: Needs assistance Equipment used: 1 person hand held assist, Rolling walker (2 wheels) Transfers: Sit to/from Stand, Bed to chair/wheelchair/BSC Sit to Stand: Min assist, Mod assist (sit>stand from EOB with 1UE HHA, sit>stand from recliner with RW, cuing re: hand placement, assistance to power up to standing)   Step pivot transfers: Min assist (1UE HHA)            Ambulation/Gait                  Stairs            Wheelchair Mobility     Tilt Bed    Modified Rankin (Stroke Patients Only)       Balance Overall balance assessment: Needs assistance Sitting-balance support: Feet supported Sitting balance-Leahy Scale: Fair Sitting balance - Comments: CGA static sitting EOB   Standing balance support: Bilateral upper extremity supported, Reliant on assistive device for balance, During functional activity Standing balance-Leahy Scale: Poor Standing balance comment: tolerates standing <15 seconds with RW & that's with heavy encouragement to allow PT to perform peri hygiene 2/2 incontinent smear BM                             Pertinent Vitals/Pain Pain Assessment Pain Assessment: Faces Faces Pain Scale:  No hurt    Home Living Family/patient expects to be discharged to:: Assisted living                 Home Equipment: Rollator (4 wheels)      Prior Function Prior Level of Function : Patient poor historian/Family not available             Mobility Comments: Pt is a poor historian, reports she's from [facility], uses rollator. ADLs Comments: pt poor historian, states she is independent in ADLs including standing showers but question accuracy.     Extremity/Trunk Assessment   Upper Extremity  Assessment Upper Extremity Assessment: Generalized weakness    Lower Extremity Assessment Lower Extremity Assessment: Generalized weakness    Cervical / Trunk Assessment Cervical / Trunk Assessment: Kyphotic  Communication   Communication Communication: Impaired Factors Affecting Communication: Reduced clarity of speech (speaks at low volume)    Cognition Arousal: Alert Behavior During Therapy: WFL for tasks assessed/performed   PT - Cognitive impairments: History of cognitive impairments, Orientation, Memory, Problem solving, Safety/Judgement   Orientation impairments: Place, Situation                   PT - Cognition Comments: Oriented to self, reports Christmas is coming up Following commands: Intact, Impaired Following commands impaired: Follows one step commands inconsistently, Follows one step commands with increased time     Cueing Cueing Techniques: Verbal cues, Gestural cues, Tactile cues     General Comments General comments (skin integrity, edema, etc.): HR & SpO2 on RA WNL. BP 151/87 (108) assessed supine after pt reported feeling like she was going to pass out.    Exercises     Assessment/Plan    PT Assessment Patient needs continued PT services  PT Problem List Decreased strength;Decreased cognition;Decreased activity tolerance;Decreased balance;Decreased mobility;Decreased safety awareness;Decreased knowledge of use of DME       PT Treatment Interventions DME instruction;Therapeutic exercise;Gait training;Balance training;Stair training;Neuromuscular re-education;Functional mobility training;Therapeutic activities;Patient/family education    PT Goals (Current goals can be found in the Care Plan section)  Acute Rehab PT Goals PT Goal Formulation: Patient unable to participate in goal setting Time For Goal Achievement: 01/14/24 Potential to Achieve Goals: Good    Frequency Min 2X/week     Co-evaluation               AM-PAC PT 6  Clicks Mobility  Outcome Measure Help needed turning from your back to your side while in a flat bed without using bedrails?: None Help needed moving from lying on your back to sitting on the side of a flat bed without using bedrails?: A Little Help needed moving to and from a bed to a chair (including a wheelchair)?: A Little Help needed standing up from a chair using your arms (e.g., wheelchair or bedside chair)?: A Little Help needed to walk in hospital room?: A Lot Help needed climbing 3-5 steps with a railing? : Total 6 Click Score: 16    End of Session   Activity Tolerance: Patient limited by fatigue Patient left: in chair;with chair alarm set;with call bell/phone within reach;with nursing/sitter in room Nurse Communication: Mobility status (BP) PT Visit Diagnosis: Muscle weakness (generalized) (M62.81);Difficulty in walking, not elsewhere classified (R26.2);Unsteadiness on feet (R26.81)    Time: 9050-8987 PT Time Calculation (min) (ACUTE ONLY): 23 min   Charges:   PT Evaluation $PT Eval Low Complexity: 1 Low   PT General Charges $$ ACUTE PT VISIT: 1 Visit  Richerd Pinal, PT, DPT 12/31/2023, 10:24 AM   Richerd CHRISTELLA Pinal 12/31/2023, 10:21 AM

## 2023-12-31 NOTE — Progress Notes (Signed)
 Mobility Specialist - Progress Note   12/31/23 1214  Mobility  Activity Pivoted/transferred from chair to bed  Level of Assistance Minimal assist, patient does 75% or more  Assistive Device Front wheel walker  Distance Ambulated (ft) 3 ft  Activity Response Tolerated well  Mobility visit 1 Mobility  Mobility Specialist Start Time (ACUTE ONLY) 1150  Mobility Specialist Stop Time (ACUTE ONLY) 1210  Mobility Specialist Time Calculation (min) (ACUTE ONLY) 20 min   Pt sitting in the recliner upon entry, utilizing RA--- soiled. Pt required Mod-MinA to stand from the recliner, remains standing for ~1 min while NT completes peri care before returning seated d/t fatigue. Pt stands from the recliner and transfers to bed MinA via SPT, tolerated well. Pt left supine with alarm set needs within reach.  America Silvan Mobility Specialist 12/31/2023 12:32 PM

## 2023-12-31 NOTE — Care Management Obs Status (Signed)
 MEDICARE OBSERVATION STATUS NOTIFICATION   Patient Details  Name: Haley Mooney MRN: [id number] Date of Birth: 22-Jan-1940   Medicare Observation Status Notification Given:  Yes    Rojelio SHAUNNA Rattler 12/31/2023, 12:19 PM

## 2023-12-31 NOTE — Progress Notes (Signed)
 PHARMACY - PHYSICIAN COMMUNICATION CRITICAL VALUE ALERT - BLOOD CULTURE IDENTIFICATION (BCID)  Haley Mooney is an 83 y.o. female who presented to [facility] on 12/30/2023 with a chief complaint of confusion and hypotension.  Assessment:  4/4 bottles growing kleb pneumo (no resistance)   Name of physician (or Provider) Contacted: Djan  Current antibiotics: Ceftriaxone  1 g IV q24h  Changes to prescribed antibiotics recommended:  Increase ceftriaxone  dose to 2 g IV q24h  Results for orders placed or performed during the hospital encounter of 12/30/23  Blood Culture ID Panel (Reflexed) (Collected: 12/30/2023  6:54 PM)  Result Value Ref Range   Enterococcus faecalis NOT DETECTED NOT DETECTED   Enterococcus Faecium NOT DETECTED NOT DETECTED   Listeria monocytogenes NOT DETECTED NOT DETECTED   Staphylococcus species NOT DETECTED NOT DETECTED   Staphylococcus aureus (BCID) NOT DETECTED NOT DETECTED   Staphylococcus epidermidis NOT DETECTED NOT DETECTED   Staphylococcus lugdunensis NOT DETECTED NOT DETECTED   Streptococcus species NOT DETECTED NOT DETECTED   Streptococcus agalactiae NOT DETECTED NOT DETECTED   Streptococcus pneumoniae NOT DETECTED NOT DETECTED   Streptococcus pyogenes NOT DETECTED NOT DETECTED   A.calcoaceticus-baumannii NOT DETECTED NOT DETECTED   Bacteroides fragilis NOT DETECTED NOT DETECTED   Enterobacterales DETECTED (A) NOT DETECTED   Enterobacter cloacae complex NOT DETECTED NOT DETECTED   Escherichia coli NOT DETECTED NOT DETECTED   Klebsiella aerogenes NOT DETECTED NOT DETECTED   Klebsiella oxytoca NOT DETECTED NOT DETECTED   Klebsiella pneumoniae DETECTED (A) NOT DETECTED   Proteus species NOT DETECTED NOT DETECTED   Salmonella species NOT DETECTED NOT DETECTED   Serratia marcescens NOT DETECTED NOT DETECTED   Haemophilus influenzae NOT DETECTED NOT DETECTED   Neisseria meningitidis NOT DETECTED NOT DETECTED   Pseudomonas aeruginosa NOT DETECTED NOT  DETECTED   Stenotrophomonas maltophilia NOT DETECTED NOT DETECTED   Candida albicans NOT DETECTED NOT DETECTED   Candida auris NOT DETECTED NOT DETECTED   Candida glabrata NOT DETECTED NOT DETECTED   Candida krusei NOT DETECTED NOT DETECTED   Candida parapsilosis NOT DETECTED NOT DETECTED   Candida tropicalis NOT DETECTED NOT DETECTED   Cryptococcus neoformans/gattii NOT DETECTED NOT DETECTED   CTX-M ESBL NOT DETECTED NOT DETECTED   Carbapenem resistance IMP NOT DETECTED NOT DETECTED   Carbapenem resistance KPC NOT DETECTED NOT DETECTED   Carbapenem resistance NDM NOT DETECTED NOT DETECTED   Carbapenem resist OXA 48 LIKE NOT DETECTED NOT DETECTED   Carbapenem resistance VIM NOT DETECTED NOT DETECTED    Lum VEAR Mania, PharmD, BCPS 12/31/2023  7:50 AM

## 2023-12-31 NOTE — Plan of Care (Signed)
" °  Problem: Education: Goal: Knowledge of General Education information will improve Description: Including pain rating scale, medication(s)/side effects and non-pharmacologic comfort measures Outcome: Progressing   Problem: Clinical Measurements: Goal: Ability to maintain clinical measurements within normal limits will improve Outcome: Progressing Goal: Respiratory complications will improve Outcome: Progressing Goal: Cardiovascular complication will be avoided Outcome: Progressing   Problem: Coping: Goal: Level of anxiety will decrease Outcome: Progressing   Problem: Pain Managment: Goal: General experience of comfort will improve and/or be controlled Outcome: Progressing   Problem: Safety: Goal: Ability to remain free from injury will improve Outcome: Progressing   Problem: Skin Integrity: Goal: Risk for impaired skin integrity will decrease Outcome: Progressing   Problem: Skin Integrity: Goal: Risk for impaired skin integrity will decrease Outcome: Progressing   Problem: Tissue Perfusion: Goal: Adequacy of tissue perfusion will improve Outcome: Progressing   "

## 2023-12-31 NOTE — Progress Notes (Signed)
 " Progress Note   Patient: Haley Mooney FMW:[id number] DOB: 1940-10-20 DOA: 12/30/2023     0 DOS: the patient was seen and examined on 12/31/2023   Brief hospital course:  From HPI Haley Mooney is a 83 y.o. year old female with medical history of hypertension, hyperlipidemia, type 2 diabetes, GERD, bipolar disorder presenting to the ED from nursing facility for confusion and hypotension.   Per facility pt was confused this morning 9 am.  They stated she was lapsed in her bed but did not have a fall.  This prompted him to calling EMS and they noted her blood pressure was low which resulted in her coming to the ED.  Over the last few days she has not had any complaints.   Pt states she has not been eating well over the last 4 days. She just feels like she has no appetite. No n/v. States has malaise. No dysuria. Has increased frequency.      On arrival to the ED patient was noted to be HDS stable. Lab work obtained.  CBC without leukocytosis, mild anemia near her baseline, mild thrombocytopenia.  CMP with mild hyponatremia, normal potassium, significant elevation in her renal function consistent with AKI.  Mild decrease in her protein and albumin levels and slight elevation in her ALP levels.  UA consistent with UTI.  Lactic acid normal.  CT head without any acute findings but did show chronic ischemic changes with chronic lacunar infarct.   TRH contacted for admission.     Assessment and Plan:  Acute renal failure: Likely prerenal Continue IV fluid Monitor renal function Avoid nephrotoxic medications   Urinary Tract Infection Urinalysis consistent with UTI Follow-up on urine culture results Continue ceftriaxone     Klebsiella pneumoniae bacteremia Continue current antibiotics   HTN:  Continue amlodipine  metoprolol    HLD:  Continue statin therapy  T2DM: With neuropathy Continue gabapentin  as well as insulin  therapy  Bipolar disorder: Continue home med  MDD/GAD: continue  home meds  Chronic pain:  Continue home tramadol      VTE prophylaxis:  SQ Heparin      Family Communication: Updated at bedside    Subjective:  Seen and examined at bedside this morning Admits to improvement in her general condition Denies nausea vomiting chest pain  Physical Exam:  Gen: Elderly female sitting up in a chair in no acute distress HENT: No abnormality detected CV: normal heart sounds Lung: Clear to auscultation bilaterally Abd: No masses or tenderness noted MSK: No asymmetry, good bulk and tone Neuro: alert and oriented  Vitals:   12/31/23 0430 12/31/23 0500 12/31/23 0737 12/31/23 1005  BP: (!) 155/80  (!) 156/87 (!) 154/88  Pulse: 97  96 99  Resp: 16  16 16   Temp: 98.8 F (37.1 C)  98.3 F (36.8 C)   TempSrc:   Oral   SpO2: 98%  99% 97%  Weight:  77.5 kg      Data Reviewed: No acute intracranial abnormality seen on CT scan of the brain    Latest Ref Rng & Units 12/31/2023    4:39 AM 12/30/2023    2:46 PM 11/10/2023   11:28 PM  CBC  WBC 4.0 - 10.5 K/uL 10.3  10.5  8.1   Hemoglobin 12.0 - 15.0 g/dL 89.2  89.0  88.7   Hematocrit 36.0 - 46.0 % 30.8  32.8  33.5   Platelets 150 - 400 K/uL 102  100  165        Latest Ref  Rng & Units 12/31/2023    4:39 AM 12/30/2023    2:46 PM 11/10/2023   11:28 PM  BMP  Glucose 70 - 99 mg/dL 871  887  841   BUN 8 - 23 mg/dL 45  61  18   Creatinine 0.44 - 1.00 mg/dL 8.63  7.67  9.19   Sodium 135 - 145 mmol/L 136  133  139   Potassium 3.5 - 5.1 mmol/L 3.3  3.5  3.0   Chloride 98 - 111 mmol/L 101  97  106   CO2 22 - 32 mmol/L 20  22  23    Calcium  8.9 - 10.3 mg/dL 8.8  9.0  8.9       Author: Drue ONEIDA Potter, MD 12/31/2023 4:41 PM  For on call review [email or web address]  "

## 2024-01-01 DIAGNOSIS — N179 Acute kidney failure, unspecified: Secondary | ICD-10-CM | POA: Diagnosis not present

## 2024-01-01 LAB — BASIC METABOLIC PANEL WITH GFR
Anion gap: 11 (ref 5–15)
BUN: 26 mg/dL — ABNORMAL HIGH (ref 8–23)
CO2: 23 mmol/L (ref 22–32)
Calcium: 8.7 mg/dL — ABNORMAL LOW (ref 8.9–10.3)
Chloride: 103 mmol/L (ref 98–111)
Creatinine, Ser: 0.9 mg/dL (ref 0.44–1.00)
GFR, Estimated: >60 mL/min
Glucose, Bld: 149 mg/dL — ABNORMAL HIGH (ref 70–99)
Potassium: 3.8 mmol/L (ref 3.5–5.1)
Sodium: 137 mmol/L (ref 135–145)

## 2024-01-01 LAB — CBC WITH DIFFERENTIAL/PLATELET
Abs Immature Granulocytes: 0.22 K/uL — ABNORMAL HIGH (ref 0.00–0.07)
Basophils Absolute: 0 K/uL (ref 0.0–0.1)
Basophils Relative: 0 %
Eosinophils Absolute: 0.1 K/uL (ref 0.0–0.5)
Eosinophils Relative: 1 %
HCT: 31 % — ABNORMAL LOW (ref 36.0–46.0)
Hemoglobin: 10.5 g/dL — ABNORMAL LOW (ref 12.0–15.0)
Immature Granulocytes: 3 %
Lymphocytes Relative: 14 %
Lymphs Abs: 1.2 K/uL (ref 0.7–4.0)
MCH: 28.6 pg (ref 26.0–34.0)
MCHC: 33.9 g/dL (ref 30.0–36.0)
MCV: 84.5 fL (ref 80.0–100.0)
Monocytes Absolute: 0.7 K/uL (ref 0.1–1.0)
Monocytes Relative: 8 %
Neutro Abs: 6.3 K/uL (ref 1.7–7.7)
Neutrophils Relative %: 74 %
Platelets: 102 K/uL — ABNORMAL LOW (ref 150–400)
RBC: 3.67 MIL/uL — ABNORMAL LOW (ref 3.87–5.11)
RDW: 13.1 % (ref 11.5–15.5)
WBC: 8.5 K/uL (ref 4.0–10.5)
nRBC: 0 % (ref 0.0–0.2)

## 2024-01-01 LAB — URINE CULTURE: Culture: NO GROWTH

## 2024-01-01 LAB — GLUCOSE, CAPILLARY
Glucose-Capillary: 155 mg/dL — ABNORMAL HIGH (ref 70–99)
Glucose-Capillary: 154 mg/dL — ABNORMAL HIGH (ref 70–99)
Glucose-Capillary: 112 mg/dL — ABNORMAL HIGH (ref 70–99)
Glucose-Capillary: 222 mg/dL — ABNORMAL HIGH (ref 70–99)

## 2024-01-01 NOTE — Progress Notes (Signed)
 " Progress Note   Patient: Haley Mooney FMW:[id number] DOB: 1940/07/06 DOA: 12/30/2023     1 DOS: the patient was seen and examined on 01/01/2024     Brief hospital course:  From HPI Haley Mooney is a 83 y.o. year old female with medical history of hypertension, hyperlipidemia, type 2 diabetes, GERD, bipolar disorder presenting to the ED from nursing facility for confusion and hypotension.   Per facility pt was confused this morning 9 am.  They stated she was lapsed in her bed but did not have a fall.  This prompted him to calling EMS and they noted her blood pressure was low which resulted in her coming to the ED.  Over the last few days she has not had any complaints.   Pt states she has not been eating well over the last 4 days. She just feels like she has no appetite. No n/v. States has malaise. No dysuria. Has increased frequency.      On arrival to the ED patient was noted to be HDS stable. Lab work obtained.  CBC without leukocytosis, mild anemia near her baseline, mild thrombocytopenia.  CMP with mild hyponatremia, normal potassium, significant elevation in her renal function consistent with AKI.  Mild decrease in her protein and albumin levels and slight elevation in her ALP levels.  UA consistent with UTI.  Lactic acid normal.  CT head without any acute findings but did show chronic ischemic changes with chronic lacunar infarct.   TRH contacted for admission.       Assessment and Plan:   Acute renal failure: Likely prerenal Has been weaned off IV fluid Monitor renal function Avoid nephrotoxic medications   Urinary Tract Infection Urinalysis consistent with UTI Follow-up on urine culture results Continue ceftriaxone    Klebsiella pneumoniae bacteremia Continue current antibiotics     HTN:  Continue amlodipine  metoprolol     HLD:  Continue statin therapy   T2DM: With neuropathy Continue gabapentin  as well as insulin  therapy   Bipolar disorder: Continue home  med   MDD/GAD: continue home meds   Chronic pain:  Continue home tramadol      VTE prophylaxis:  SQ Heparin       Family Communication: Updated at bedside     Subjective:  Seen and examined at bedside this morning Patient's mental status is not at baseline yet but improving Denies chest pain abdominal pain or cough  Physical Exam:   Gen: Elderly female sitting up in a chair in no acute distress HENT: No abnormality detected CV: normal heart sounds Lung: Clear to auscultation bilaterally Abd: No masses or tenderness noted MSK: No asymmetry, good bulk and tone Neuro: alert and oriented    Vitals:   12/31/23 1958 01/01/24 0354 01/01/24 0455 01/01/24 0734  BP: (!) 146/79 113/71  138/75  Pulse: 88 78 82 77  Resp: 20 18 14 17   Temp: (!) 97.5 F (36.4 C) 97.9 F (36.6 C)  97.9 F (36.6 C)  TempSrc:  Oral  Oral  SpO2: 96% 96% 98% 98%  Weight:   76.3 kg     Data Reviewed:     Latest Ref Rng & Units 01/01/2024    4:25 AM 12/31/2023    4:39 AM 12/30/2023    2:46 PM  CBC  WBC 4.0 - 10.5 K/uL 8.5  10.3  10.5   Hemoglobin 12.0 - 15.0 g/dL 89.4  89.2  89.0   Hematocrit 36.0 - 46.0 % 31.0  30.8  32.8   Platelets 150 -  400 K/uL 102  102  100        Latest Ref Rng & Units 01/01/2024    4:25 AM 12/31/2023    4:39 AM 12/30/2023    2:46 PM  BMP  Glucose 70 - 99 mg/dL 850  871  887   BUN 8 - 23 mg/dL 26  45  61   Creatinine 0.44 - 1.00 mg/dL 9.09  8.63  7.67   Sodium 135 - 145 mmol/L 137  136  133   Potassium 3.5 - 5.1 mmol/L 3.8  3.3  3.5   Chloride 98 - 111 mmol/L 103  101  97   CO2 22 - 32 mmol/L 23  20  22    Calcium  8.9 - 10.3 mg/dL 8.7  8.8  9.0     Author: Drue ONEIDA Potter, MD 01/01/2024 4:34 PM  For on call review [email or web address]  "

## 2024-01-01 NOTE — Plan of Care (Signed)
" °  Problem: Clinical Measurements: Goal: Ability to maintain clinical measurements within normal limits will improve Outcome: Progressing Goal: Diagnostic test results will improve Outcome: Progressing Goal: Respiratory complications will improve Outcome: Progressing Goal: Cardiovascular complication will be avoided Outcome: Progressing   Problem: Nutrition: Goal: Adequate nutrition will be maintained Outcome: Progressing   Problem: Coping: Goal: Level of anxiety will decrease Outcome: Progressing   Problem: Elimination: Goal: Will not experience complications related to bowel motility Outcome: Progressing Goal: Will not experience complications related to urinary retention Outcome: Progressing   Problem: Safety: Goal: Ability to remain free from injury will improve Outcome: Progressing   Problem: Skin Integrity: Goal: Risk for impaired skin integrity will decrease Outcome: Progressing   "

## 2024-01-02 DIAGNOSIS — N179 Acute kidney failure, unspecified: Secondary | ICD-10-CM | POA: Diagnosis not present

## 2024-01-02 LAB — GLUCOSE, CAPILLARY
Glucose-Capillary: 112 mg/dL — ABNORMAL HIGH (ref 70–99)
Glucose-Capillary: 237 mg/dL — ABNORMAL HIGH (ref 70–99)
Glucose-Capillary: 183 mg/dL — ABNORMAL HIGH (ref 70–99)
Glucose-Capillary: 146 mg/dL — ABNORMAL HIGH (ref 70–99)

## 2024-01-02 LAB — CULTURE, BLOOD (ROUTINE X 2)

## 2024-01-02 MED ORDER — CEFADROXIL 500 MG PO CAPS
1000.0000 mg | ORAL_CAPSULE | Freq: Two times a day (BID) | ORAL | Status: DC
  Administered 2024-01-03 – 2024-01-05 (×5): 1000 mg via ORAL
  Filled 2024-01-02 (×5): qty 2

## 2024-01-02 NOTE — Plan of Care (Signed)
" °  Problem: Clinical Measurements: Goal: Ability to maintain clinical measurements within normal limits will improve Outcome: Progressing Goal: Will remain free from infection Outcome: Progressing Goal: Diagnostic test results will improve Outcome: Progressing Goal: Respiratory complications will improve Outcome: Progressing Goal: Cardiovascular complication will be avoided Outcome: Progressing   Problem: Coping: Goal: Level of anxiety will decrease Outcome: Progressing   Problem: Pain Managment: Goal: General experience of comfort will improve and/or be controlled Outcome: Progressing   Problem: Safety: Goal: Ability to remain free from injury will improve Outcome: Progressing   Problem: Skin Integrity: Goal: Risk for impaired skin integrity will decrease Outcome: Progressing   Problem: Metabolic: Goal: Ability to maintain appropriate glucose levels will improve Outcome: Progressing   "

## 2024-01-02 NOTE — Progress Notes (Signed)
 Physical Therapy Treatment Patient Details Name: Haley Mooney MRN: [id number] DOB: Jun 11, 1940 Today's Date: 01/02/2024   History of Present Illness Pt is an 83 y/o F admitted on 12/30/23 after presenting with confusion & hypotension. Pt is being treated for UTI. PMH: HTN, HLD, DM2, GERD, bipolar disorder, anxiety, CAD, depression, heart murmur, neuropathy, sleep apnea, stroke.    PT Comments  Pt received upright in bed agreeable to PT/OT co-treat for pt and therapist safety. Pt requiring minA for bed mobility and minA+1 to +2 for STS to RW. Pt requires VC's for hand placement and TC's on shoulders/pelvis to prevent posterior leaning initially in standing. Pt reports mild dizziness, able to perform ~2' of gait to recliner upon sitting. After 2-3 min seated rest, able to STS minA+1 and ambulate 40' relying on chair follow for safety. Pt remains needing VC's for body positioning inside RW and maintaining RW closer to BOS. Pt with good carryover but requesting seated rest. Pt transported back into room with all needs in reach. Pt making good progress in POC with d/c recs remaining appropriate.    If plan is discharge home, recommend the following: A lot of help with walking and/or transfers;A lot of help with bathing/dressing/bathroom;Assist for transportation;Assistance with cooking/housework;Help with stairs or ramp for entrance   Can travel by private vehicle     No  Equipment Recommendations  Other (comment) (TBD by next venue of care)    Recommendations for Other Services       Precautions / Restrictions Precautions Precautions: Fall Recall of Precautions/Restrictions: Impaired Restrictions Weight Bearing Restrictions Per Provider Order: No     Mobility  Bed Mobility Overal bed mobility: Needs Assistance Bed Mobility: Supine to Sit     Supine to sit: HOB elevated, Min assist       Patient Response: Cooperative, Flat affect  Transfers Overall transfer level: Needs  assistance Equipment used: Rolling walker (2 wheels) Transfers: Sit to/from Stand Sit to Stand: Min assist           General transfer comment: VC's for hand placement    Ambulation/Gait Ambulation/Gait assistance: Contact guard assist Gait Distance (Feet): 40 Feet Assistive device: Rolling walker (2 wheels) Gait Pattern/deviations: Step-to pattern, Shuffle       General Gait Details: step to shuffling pattern   Stairs             Wheelchair Mobility     Tilt Bed Tilt Bed Patient Response: Cooperative, Flat affect  Modified Rankin (Stroke Patients Only)       Balance Overall balance assessment: Needs assistance Sitting-balance support: Feet supported Sitting balance-Leahy Scale: Fair       Standing balance-Leahy Scale: Poor Standing balance comment: reliant on UE support on RW                            Communication Communication Communication: No apparent difficulties  Cognition Arousal: Alert Behavior During Therapy: WFL for tasks assessed/performed   PT - Cognitive impairments: History of cognitive impairments, Orientation, Memory, Problem solving, Safety/Judgement                         Following commands: Intact, Impaired Following commands impaired: Follows one step commands inconsistently, Follows one step commands with increased time    Cueing Cueing Techniques: Verbal cues, Gestural cues, Tactile cues  Exercises      General Comments        Pertinent Vitals/Pain  Pain Assessment Pain Assessment: No/denies pain    Home Living                          Prior Function            PT Goals (current goals can now be found in the care plan section) Acute Rehab PT Goals PT Goal Formulation: Patient unable to participate in goal setting Time For Goal Achievement: 01/14/24 Potential to Achieve Goals: Good Progress towards PT goals: Progressing toward goals    Frequency    Min 2X/week       PT Plan      Co-evaluation PT/OT/SLP Co-Evaluation/Treatment: Yes Reason for Co-Treatment: Complexity of the patient's impairments (multi-system involvement);For patient/therapist safety PT goals addressed during session: Mobility/safety with mobility;Balance OT goals addressed during session: ADL's and self-care      AM-PAC PT 6 Clicks Mobility   Outcome Measure  Help needed turning from your back to your side while in a flat bed without using bedrails?: A Little Help needed moving from lying on your back to sitting on the side of a flat bed without using bedrails?: A Little Help needed moving to and from a bed to a chair (including a wheelchair)?: A Little Help needed standing up from a chair using your arms (e.g., wheelchair or bedside chair)?: A Little Help needed to walk in hospital room?: A Little Help needed climbing 3-5 steps with a railing? : A Lot 6 Click Score: 17    End of Session Equipment Utilized During Treatment: Gait belt Activity Tolerance: Patient tolerated treatment well Patient left: in chair;with chair alarm set;with call bell/phone within reach Nurse Communication: Mobility status PT Visit Diagnosis: Muscle weakness (generalized) (M62.81);Difficulty in walking, not elsewhere classified (R26.2);Unsteadiness on feet (R26.81)     Time: 8675-8652 PT Time Calculation (min) (ACUTE ONLY): 23 min  Charges:    $Therapeutic Activity: 8-22 mins PT General Charges $$ ACUTE PT VISIT: 1 Visit                     Dorina HERO. Fairly IV, PT, DPT Physical Therapist- [place] 01/02/2024, 3:18 PM

## 2024-01-02 NOTE — Progress Notes (Addendum)
 Occupational Therapy Treatment Patient Details Name: Haley Mooney MRN: [id number] DOB: 1940-09-22 Today's Date: 01/02/2024   History of present illness Pt is an 83 y/o F admitted on 12/30/23 after presenting with confusion & hypotension. Pt is being treated for UTI. PMH: HTN, HLD, DM2, GERD, bipolar disorder, anxiety, CAD, depression, heart murmur, neuropathy, sleep apnea, stroke.   OT comments  Chart reviewed to date, pt greeted semi supine in bed, agreeable to OT tx session targeting improving functional activity tolerance in prep for ADL tasks. Pt is making progress towards goals as evidenced by amb approx 40' with RW with CGA, +2 for close chair follow. Frequent vcs for technique. No dizziness reported while amb, mild dizziness with initial supine>sit. MAX A for LB dressing, SET UP for grooming tasks. Pt is left in chair, all needs met. OT will continue to follow.       If plan is discharge home, recommend the following:  A little help with walking and/or transfers;A little help with bathing/dressing/bathroom;Supervision due to cognitive status   Equipment Recommendations  None recommended by OT    Recommendations for Other Services      Precautions / Restrictions Precautions Precautions: Fall Recall of Precautions/Restrictions: Impaired Restrictions Weight Bearing Restrictions Per Provider Order: No       Mobility Bed Mobility Overal bed mobility: Needs Assistance Bed Mobility: Supine to Sit     Supine to sit: HOB elevated, Min assist          Transfers Overall transfer level: Needs assistance Equipment used: Rolling walker (2 wheels)   Sit to Stand: Min assist                 Balance Overall balance assessment: Needs assistance Sitting-balance support: Feet supported Sitting balance-Leahy Scale: Fair     Standing balance support: Bilateral upper extremity supported, Reliant on assistive device for balance, During functional activity Standing  balance-Leahy Scale: Poor                             ADL either performed or assessed with clinical judgement   ADL Overall ADL's : Needs assistance/impaired                     Lower Body Dressing: Maximal assistance;Sitting/lateral leans   Toilet Transfer: Contact guard assist;Rolling walker (2 wheels);Ambulation;Cueing for safety Toilet Transfer Details (indicate cue type and reason): simulated         Functional mobility during ADLs: Contact guard assist;Cueing for sequencing;Rolling walker (2 wheels) (approx 40' with chair follow)      Extremity/Trunk Assessment              Vision       Perception     Praxis     Communication Communication Communication: No apparent difficulties   Cognition Arousal: Alert Behavior During Therapy: WFL for tasks assessed/performed Cognition: Cognition impaired, No family/caregiver present to determine baseline   Orientation impairments: Time, Situation Awareness: Intellectual awareness impaired Memory impairment (select all impairments): Short-term memory, Working memory Attention impairment (select first level of impairment): Sustained attention Executive functioning impairment (select all impairments): Reasoning, Problem solving                   Following commands: Impaired Following commands impaired: Follows one step commands with increased time      Cueing   Cueing Techniques: Verbal cues, Gestural cues, Tactile cues  Exercises Other Exercises Other Exercises: edu  re role of OT, role of rehab    Shoulder Instructions       General Comments vss    Pertinent Vitals/ Pain       Pain Assessment Pain Assessment: No/denies pain  Home Living                                          Prior Functioning/Environment              Frequency  Min 2X/week        Progress Toward Goals  OT Goals(current goals can now be found in the care plan section)   Progress towards OT goals: Progressing toward goals  Acute Rehab OT Goals Time For Goal Achievement: 01/14/24  Plan      Co-evaluation    PT/OT/SLP Co-Evaluation/Treatment: Yes Reason for Co-Treatment: Complexity of the patient's impairments (multi-system involvement);For patient/therapist safety PT goals addressed during session: Mobility/safety with mobility;Balance OT goals addressed during session: ADL's and self-care      AM-PAC OT 6 Clicks Daily Activity     Outcome Measure   Help from another person eating meals?: A Little Help from another person taking care of personal grooming?: A Little Help from another person toileting, which includes using toliet, bedpan, or urinal?: A Lot Help from another person bathing (including washing, rinsing, drying)?: A Lot Help from another person to put on and taking off regular upper body clothing?: A Little Help from another person to put on and taking off regular lower body clothing?: A Lot 6 Click Score: 15    End of Session Equipment Utilized During Treatment: Rolling walker (2 wheels);Gait belt  OT Visit Diagnosis: Muscle weakness (generalized) (M62.81);History of falling (Z91.81);Other abnormalities of gait and mobility (R26.89);Unsteadiness on feet (R26.81)   Activity Tolerance Patient tolerated treatment well   Patient Left in chair;with call bell/phone within reach;with chair alarm set   Nurse Communication  (NT re status)        Time: 8678-8652 OT Time Calculation (min): 26 min  Charges: OT General Charges $OT Visit: 1 Visit OT Treatments $Therapeutic Activity: 8-22 mins  Therisa Sheffield, OTD OTR/L  01/02/2024, 3:48 PM

## 2024-01-02 NOTE — TOC Initial Note (Signed)
 Transition of Care [facility]) - Initial/Assessment Note    Patient Details  Name: Haley Mooney MRN: [id number] Date of Birth: 03/27/1940  [facility] CM/SW Contact:    Alfonso Rummer, LCSW Phone Number: 01/02/2024, 1:38 PM  Clinical Narrative:                  KEN DELENA Rummer met wit patient in room 203 to advise of snf recommendations for physical and occupational therapy. LCSW A Rummer also spoke with patient Haley Mooney. Ms. Mooney reports [facility] has pt and ot services on the premises. Pt will return back to [facility]. Pt daughter also reports family will transport pt back to facility and asked staff to ensure pt cell phone and cell phone charger is with pt upon medically discharge.       Patient Goals and CMS Choice            Expected Discharge Plan and Services                                              Prior Living Arrangements/Services                       Activities of Daily Living   ADL Screening (condition at time of admission) Independently performs ADLs?: No Is the patient deaf or have difficulty hearing?: No Does the patient have difficulty seeing, even when wearing glasses/contacts?: No Does the patient have difficulty concentrating, remembering, or making decisions?: Yes  Permission Sought/Granted                  Emotional Assessment              Admission diagnosis:  Lower urinary tract infectious disease [N39.0] AKI (acute kidney injury) [N17.9] Patient Active Problem List   Diagnosis Date Noted   AKI (acute kidney injury) 12/30/2023   Essential hypertension 12/30/2023   Hyperlipidemia 12/30/2023   GERD (gastroesophageal reflux disease) 12/30/2023   GAD (generalized anxiety disorder) 12/30/2023   Bipolar disease, chronic (HCC) 12/30/2023   CVA (cerebral vascular accident) (HCC) 08/21/2017   PCP:  Agustina Greig NOVAK, MD Pharmacy:   [facility] [phone] [place] - [address]  [address] [place] [address] Phone: [phone] Fax: [phone]     Social Drivers of Health (SDOH) Social History: SDOH Screenings   Food Insecurity: Patient Unable To Answer (12/31/2023)  Housing: Patient Unable To Answer (12/31/2023)  Transportation Needs: Patient Unable To Answer (12/31/2023)  Utilities: Patient Unable To Answer (12/31/2023)  Financial Resource Strain: Low Risk (02/25/2023)   Received from [facility]  Physical Activity: Inactive (05/27/2023)   Received from [facility]  Social Connections: Patient Unable To Answer (12/31/2023)  Stress: No Stress Concern Present (05/27/2023)   Received from [facility] Cl[facility]  Tobacco Use: High Risk (12/30/2023)  Health Literacy: Medium Risk (02/25/2023)   Received from [facility]   SDOH Interventions:     Readmission Risk Interventions     No data to display

## 2024-01-02 NOTE — Progress Notes (Signed)
 " Progress Note   Patient: Haley Mooney FMW:[id number] DOB: 07/19/1940 DOA: 12/30/2023     2 DOS: the patient was seen and examined on 01/02/2024     Brief hospital course:  From HPI Haley Mooney is a 83 y.o. year old female with medical history of hypertension, hyperlipidemia, type 2 diabetes, GERD, bipolar disorder presenting to the ED from nursing facility for confusion and hypotension.   Per facility pt was confused this morning 9 am.  They stated she was lapsed in her bed but did not have a fall.  This prompted him to calling EMS and they noted her blood pressure was low which resulted in her coming to the ED.  Over the last few days she has not had any complaints.   Pt states she has not been eating well over the last 4 days. She just feels like she has no appetite. No n/v. States has malaise. No dysuria. Has increased frequency.      On arrival to the ED patient was noted to be HDS stable. Lab work obtained.  CBC without leukocytosis, mild anemia near her baseline, mild thrombocytopenia.  CMP with mild hyponatremia, normal potassium, significant elevation in her renal function consistent with AKI.  Mild decrease in her protein and albumin levels and slight elevation in her ALP levels.  UA consistent with UTI.  Lactic acid normal.  CT head without any acute findings but did show chronic ischemic changes with chronic lacunar infarct.   TRH contacted for admission.       Assessment and Plan:   Acute renal failure: Likely prerenal Has been weaned off IV fluid Monitor renal function Avoid nephrotoxic medications   Urinary Tract Infection Urinalysis consistent with UTI Follow-up on urine culture results Continue ceftriaxone    Klebsiella pneumoniae bacteremia Continue current antibiotics I have discussed with ID pharmacist and from tomorrow we can de-escalate to oral antibiotics     HTN:  Continue amlodipine  metoprolol     HLD:  Continue statin therapy   T2DM: With  neuropathy Continue gabapentin  as well as insulin  therapy   Bipolar disorder: Continue home med   MDD/GAD: continue home meds   Chronic pain:  Continue home tramadol      VTE prophylaxis:  SQ Heparin     Disposition: SNF TOC manager working on this  Family Communication: Updated at bedside     Subjective:  Seen and examined at bedside this morning Patient has been seen by PT OT today with recommendation for SNF [facility] manager working on this She admits to improvement in respiratory function   Physical Exam:   Gen: Elderly female sitting up in a chair in no acute distress HENT: No abnormality detected CV: normal heart sounds Lung: Clear to auscultation bilaterally Abd: No masses or tenderness noted MSK: No asymmetry, good bulk and tone Neuro: alert and oriented    Vitals:   01/02/24 0505 01/02/24 0743 01/02/24 1055 01/02/24 1543  BP: 122/72 134/75 120/74 (!) 119/95  Pulse: 83 83 (!) 101 81  Resp: 20 18  (!) 21  Temp: 98 F (36.7 C) 98.1 F (36.7 C)    TempSrc:  Oral    SpO2: 99% 98%  98%  Weight:          Latest Ref Rng & Units 01/01/2024    4:25 AM 12/31/2023    4:39 AM 12/30/2023    2:46 PM  CBC  WBC 4.0 - 10.5 K/uL 8.5  10.3  10.5   Hemoglobin 12.0 - 15.0  g/dL 89.4  89.2  89.0   Hematocrit 36.0 - 46.0 % 31.0  30.8  32.8   Platelets 150 - 400 K/uL 102  102  100        Latest Ref Rng & Units 01/01/2024    4:25 AM 12/31/2023    4:39 AM 12/30/2023    2:46 PM  BMP  Glucose 70 - 99 mg/dL 850  871  887   BUN 8 - 23 mg/dL 26  45  61   Creatinine 0.44 - 1.00 mg/dL 9.09  8.63  7.67   Sodium 135 - 145 mmol/L 137  136  133   Potassium 3.5 - 5.1 mmol/L 3.8  3.3  3.5   Chloride 98 - 111 mmol/L 103  101  97   CO2 22 - 32 mmol/L 23  20  22    Calcium  8.9 - 10.3 mg/dL 8.7  8.8  9.0      Author: Drue ONEIDA Potter, MD 01/02/2024 5:36 PM  For on call review [email or web address]  "

## 2024-01-02 NOTE — Care Management Obs Status (Signed)
 MEDICARE OBSERVATION STATUS NOTIFICATION   Patient Details  Name: Haley Mooney MRN: [id number] Date of Birth: 11/07/1940   Medicare Observation Status Notification Given:  No  Patient admitted  Haley Mooney 01/02/2024, 3:51 PM

## 2024-01-03 DIAGNOSIS — N179 Acute kidney failure, unspecified: Secondary | ICD-10-CM | POA: Diagnosis not present

## 2024-01-03 LAB — CBC WITH DIFFERENTIAL/PLATELET
Abs Immature Granulocytes: 0.72 K/uL — ABNORMAL HIGH (ref 0.00–0.07)
Basophils Absolute: 0.1 K/uL (ref 0.0–0.1)
Basophils Relative: 1 %
Eosinophils Absolute: 0.2 K/uL (ref 0.0–0.5)
Eosinophils Relative: 2 %
HCT: 30.2 % — ABNORMAL LOW (ref 36.0–46.0)
Hemoglobin: 10.3 g/dL — ABNORMAL LOW (ref 12.0–15.0)
Immature Granulocytes: 8 %
Lymphocytes Relative: 19 %
Lymphs Abs: 1.7 K/uL (ref 0.7–4.0)
MCH: 28.1 pg (ref 26.0–34.0)
MCHC: 34.1 g/dL (ref 30.0–36.0)
MCV: 82.3 fL (ref 80.0–100.0)
Monocytes Absolute: 0.6 K/uL (ref 0.1–1.0)
Monocytes Relative: 6 %
Neutro Abs: 5.9 K/uL (ref 1.7–7.7)
Neutrophils Relative %: 64 %
Platelets: 148 K/uL — ABNORMAL LOW (ref 150–400)
RBC: 3.67 MIL/uL — ABNORMAL LOW (ref 3.87–5.11)
RDW: 13.1 % (ref 11.5–15.5)
Smear Review: NORMAL
WBC: 9.2 K/uL (ref 4.0–10.5)
nRBC: 0 % (ref 0.0–0.2)

## 2024-01-03 LAB — GLUCOSE, CAPILLARY
Glucose-Capillary: 113 mg/dL — ABNORMAL HIGH (ref 70–99)
Glucose-Capillary: 203 mg/dL — ABNORMAL HIGH (ref 70–99)
Glucose-Capillary: 211 mg/dL — ABNORMAL HIGH (ref 70–99)
Glucose-Capillary: 224 mg/dL — ABNORMAL HIGH (ref 70–99)

## 2024-01-03 LAB — BASIC METABOLIC PANEL WITH GFR
Anion gap: 12 (ref 5–15)
BUN: 12 mg/dL (ref 8–23)
CO2: 25 mmol/L (ref 22–32)
Calcium: 8.5 mg/dL — ABNORMAL LOW (ref 8.9–10.3)
Chloride: 99 mmol/L (ref 98–111)
Creatinine, Ser: 0.69 mg/dL (ref 0.44–1.00)
GFR, Estimated: >60 mL/min
Glucose, Bld: 110 mg/dL — ABNORMAL HIGH (ref 70–99)
Potassium: 2.7 mmol/L — CL (ref 3.5–5.1)
Sodium: 136 mmol/L (ref 135–145)

## 2024-01-03 MED ORDER — POTASSIUM CHLORIDE CRYS ER 20 MEQ PO TBCR
40.0000 meq | EXTENDED_RELEASE_TABLET | Freq: Two times a day (BID) | ORAL | Status: AC
  Administered 2024-01-03 (×2): 40 meq via ORAL
  Filled 2024-01-03 (×2): qty 2

## 2024-01-03 NOTE — Plan of Care (Signed)

## 2024-01-03 NOTE — Progress Notes (Signed)
 " Progress Note   Patient: Aryiana Klinkner FMW:[id number] DOB: 1940-11-10 DOA: 12/30/2023     3 DOS: the patient was seen and examined on 01/03/2024    Brief hospital course:  From HPI Emiko Osorto is a 83 y.o. year old female with medical history of hypertension, hyperlipidemia, type 2 diabetes, GERD, bipolar disorder presenting to the ED from nursing facility for confusion and hypotension.   On arrival to the ED patient was noted to be HDS stable. Lab work obtained.  CBC without leukocytosis, mild anemia near her baseline, mild thrombocytopenia.  CMP with mild hyponatremia, normal potassium, significant elevation in her renal function consistent with AKI.  Mild decrease in her protein and albumin levels and slight elevation in her ALP levels.  UA consistent with UTI.  Lactic acid normal.  CT head without any acute findings but did show chronic ischemic changes with chronic lacunar infarct.   TRH contacted for admission.       Assessment and Plan:   Acute renal failure: Likely prerenal Has been weaned off IV fluid Monitor renal function Avoid nephrotoxic medications   Urinary Tract Infection Urinalysis consistent with UTI Follow-up on urine culture results Continue cefadroxil    Klebsiella pneumoniae bacteremia Continue current antibiotics I have discussed with ID pharmacist and antibiotics have been de-escalated from ceftriaxone  to cefadroxil    Severe hypokalemia Patient with potassium of 2.7 Continue repletion and monitoring  HTN:  Continue amlodipine  metoprolol     HLD:  Continue statin therapy   T2DM: With neuropathy Continue gabapentin  as well as insulin  therapy   Bipolar disorder: Continue home med   MDD/GAD: continue home meds   Chronic pain:  Continue home tramadol      VTE prophylaxis:  SQ Heparin       Family Communication: Updated at bedside     Subjective:  Seen and examined at bedside this morning PT recommends SNF, TOC working on this She  admits to improvement in respiratory function   Physical Exam:   Gen: Elderly female sitting up in a chair in no acute distress HENT: No abnormality detected CV: normal heart sounds Lung: Clear to auscultation bilaterally Abd: No masses or tenderness noted MSK: No asymmetry, good bulk and tone Neuro: alert and oriented      Latest Ref Rng & Units 01/03/2024    4:56 AM 01/01/2024    4:25 AM 12/31/2023    4:39 AM  CBC  WBC 4.0 - 10.5 K/uL 9.2  8.5  10.3   Hemoglobin 12.0 - 15.0 g/dL 89.6  89.4  89.2   Hematocrit 36.0 - 46.0 % 30.2  31.0  30.8   Platelets 150 - 400 K/uL 148  102  102        Latest Ref Rng & Units 01/03/2024    4:56 AM 01/01/2024    4:25 AM 12/31/2023    4:39 AM  BMP  Glucose 70 - 99 mg/dL 889  850  871   BUN 8 - 23 mg/dL 12  26  45   Creatinine 0.44 - 1.00 mg/dL 9.30  9.09  8.63   Sodium 135 - 145 mmol/L 136  137  136   Potassium 3.5 - 5.1 mmol/L 2.7  3.8  3.3   Chloride 98 - 111 mmol/L 99  103  101   CO2 22 - 32 mmol/L 25  23  20    Calcium  8.9 - 10.3 mg/dL 8.5  8.7  8.8      Vitals:   01/03/24 0450 01/03/24 0500  01/03/24 0755 01/03/24 1542  BP: 134/75  139/76 112/68  Pulse: 82  84 80  Resp: 18  16 16   Temp: 98.4 F (36.9 C)  98.5 F (36.9 C) 98.4 F (36.9 C)  TempSrc: Oral     SpO2: 98%  99% 99%  Weight:  76.6 kg    Height:         Disposition: SNF  Author: Drue ONEIDA Potter, MD 01/03/2024 4:47 PM  For on call review [email or web address]  "

## 2024-01-03 NOTE — Plan of Care (Signed)
  Problem: Education: Goal: Knowledge of General Education information will improve Description: Including pain rating scale, medication(s)/side effects and non-pharmacologic comfort measures Outcome: Progressing   Problem: Health Behavior/Discharge Planning: Goal: Ability to manage health-related needs will improve Outcome: Progressing   Problem: Clinical Measurements: Goal: Ability to maintain clinical measurements within normal limits will improve Outcome: Progressing Goal: Will remain free from infection Outcome: Progressing Goal: Diagnostic test results will improve Outcome: Progressing Goal: Respiratory complications will improve Outcome: Progressing Goal: Cardiovascular complication will be avoided Outcome: Progressing   Problem: Activity: Goal: Risk for activity intolerance will decrease Outcome: Progressing   Problem: Nutrition: Goal: Adequate nutrition will be maintained Outcome: Progressing   Problem: Coping: Goal: Level of anxiety will decrease Outcome: Progressing   Problem: Elimination: Goal: Will not experience complications related to bowel motility Outcome: Progressing Goal: Will not experience complications related to urinary retention Outcome: Progressing   Problem: Pain Managment: Goal: General experience of comfort will improve and/or be controlled Outcome: Progressing   Problem: Safety: Goal: Ability to remain free from injury will improve Outcome: Progressing   Problem: Skin Integrity: Goal: Risk for impaired skin integrity will decrease Outcome: Progressing   Problem: Education: Goal: Ability to describe self-care measures that may prevent or decrease complications (Diabetes Survival Skills Education) will improve Outcome: Progressing Goal: Individualized Educational Video(s) Outcome: Progressing   Problem: Coping: Goal: Ability to adjust to condition or change in health will improve Outcome: Progressing

## 2024-01-04 DIAGNOSIS — R7881 Bacteremia: Secondary | ICD-10-CM | POA: Diagnosis not present

## 2024-01-04 LAB — GLUCOSE, CAPILLARY
Glucose-Capillary: 161 mg/dL — ABNORMAL HIGH (ref 70–99)
Glucose-Capillary: 167 mg/dL — ABNORMAL HIGH (ref 70–99)
Glucose-Capillary: 178 mg/dL — ABNORMAL HIGH (ref 70–99)
Glucose-Capillary: 280 mg/dL — ABNORMAL HIGH (ref 70–99)
Glucose-Capillary: 282 mg/dL — ABNORMAL HIGH (ref 70–99)
Glucose-Capillary: 124 mg/dL — ABNORMAL HIGH (ref 70–99)

## 2024-01-04 LAB — BASIC METABOLIC PANEL WITH GFR
Anion gap: 11 (ref 5–15)
BUN: 11 mg/dL (ref 8–23)
CO2: 23 mmol/L (ref 22–32)
Calcium: 8.3 mg/dL — ABNORMAL LOW (ref 8.9–10.3)
Chloride: 101 mmol/L (ref 98–111)
Creatinine, Ser: 0.61 mg/dL (ref 0.44–1.00)
GFR, Estimated: >60 mL/min
Glucose, Bld: 219 mg/dL — ABNORMAL HIGH (ref 70–99)
Potassium: 3.5 mmol/L (ref 3.5–5.1)
Sodium: 135 mmol/L (ref 135–145)

## 2024-01-04 NOTE — Plan of Care (Signed)

## 2024-01-04 NOTE — Plan of Care (Signed)

## 2024-01-04 NOTE — Progress Notes (Signed)
 " PROGRESS NOTE    Haley Mooney  FMW:[id number] DOB: 1940-08-15 DOA: 12/30/2023 PCP: Agustina Greig NOVAK, MD   Assessment & Plan:   Principal Problem:   AKI (acute kidney injury) Active Problems:   Essential hypertension   Hyperlipidemia   GERD (gastroesophageal reflux disease)   GAD (generalized anxiety disorder)   Bipolar disease, chronic (HCC)  Assessment and Plan: Bacteremia: blood cxs growing klebsiella. Continue on cefadroxil . Repeat blood cxs ordered  AKI: likely prerenal. Resolved  Possible UTI: urine cx showed no growth. Continue on cefadroxil   Continue cefadroxil    Hypokalemia: WNL today    HTN: continue on amlodipine , metoprolol     HLD: continue on statin    DM2: well controlled, HbA1c 6.4. Continue on SSI w/ accuchecks   Peripheral neuropathy: continue on home dose of gabapentin     Bipolar disorder: severity unknown. Continue on home dose of seroquel    MDD/GAD: severity unknown. Continue on venlafaxine     Chronic pain: no longer taking tramadol  as per med rec. Tylenol  prn         DVT prophylaxis: heparin  SQ Code Status: DNR Family Communication: discussed pt's care w/ pt's daughter, Dorothyann and answered her questions  Disposition Plan: likely d/c back to home facility. Pt's daughter doesn't want her to go to SNF   Level of care: Telemetry  Status is: Inpatient Remains inpatient appropriate because: severity of illness, possible d/c tomorrow     Consultants:    Procedures:   Antimicrobials: cefadroxil   Subjective: Pt c/o fatigue   Objective: Vitals:   01/03/24 1542 01/03/24 1942 01/04/24 0422 01/04/24 0814  BP: 112/68 108/72 133/65 136/72  Pulse: 80 85 76 77  Resp: 16 16 16 18   Temp: 98.4 F (36.9 C) 98.9 F (37.2 C) 98 F (36.7 C) 97.6 F (36.4 C)  TempSrc:  Oral  Oral  SpO2: 99% 100% 99% 98%  Weight:      Height:        Intake/Output Summary (Last 24 hours) at 01/04/2024 0959 Last data filed at 01/03/2024 2300 Gross per 24  hour  Intake 970 ml  Output 200 ml  Net 770 ml   Filed Weights   01/02/24 0500 01/02/24 2122 01/03/24 0500  Weight: 74.5 kg 76.9 kg 76.6 kg    Examination:  General exam: Appears calm and comfortable  Respiratory system: Clear to auscultation. Respiratory effort normal. Cardiovascular system: S1 & S2+. No rubs, gallops or clicks.  Gastrointestinal system: Abdomen is nondistended, soft and nontender. Normal bowel sounds heard. Central nervous system: Alert and awake. Moves all extremities Psychiatry: Judgement and insight appears at baseline. Flat mood and affect   Data Reviewed: I have personally reviewed following labs and imaging studies  CBC: Recent Labs  Lab 12/30/23 1446 12/31/23 0439 01/01/24 0425 01/03/24 0456  WBC 10.5 10.3 8.5 9.2  NEUTROABS  --   --  6.3 5.9  HGB 10.9* 10.7* 10.5* 10.3*  HCT 32.8* 30.8* 31.0* 30.2*  MCV 84.5 82.6 84.5 82.3  PLT 100* 102* 102* 148*   Basic Metabolic Panel: Recent Labs  Lab 12/30/23 1446 12/31/23 0439 01/01/24 0425 01/03/24 0456 01/04/24 0421  NA 133* 136 137 136 135  K 3.5 3.3* 3.8 2.7* 3.5  CL 97* 101 103 99 101  CO2 22 20* 23 25 23   GLUCOSE 112* 128* 149* 110* 219*  BUN 61* 45* 26* 12 11  CREATININE 2.32* 1.36* 0.90 0.69 0.61  CALCIUM  9.0 8.8* 8.7* 8.5* 8.3*  MG 2.2  --   --   --   --  GFR: Estimated Creatinine Clearance: 56.4 mL/min (by C-G formula based on SCr of 0.61 mg/dL). Liver Function Tests: Recent Labs  Lab 12/30/23 1446  AST 27  ALT 24  ALKPHOS 147*  BILITOT 0.8  PROT 6.4*  ALBUMIN 3.3*   No results for input(s): LIPASE, AMYLASE in the last 168 hours. No results for input(s): AMMONIA in the last 168 hours. Coagulation Profile: No results for input(s): INR, PROTIME in the last 168 hours. Cardiac Enzymes: No results for input(s): CKTOTAL, CKMB, CKMBINDEX, TROPONINI in the last 168 hours. BNP (last 3 results) No results for input(s): PROBNP in the last 8760  hours. HbA1C: No results for input(s): HGBA1C in the last 72 hours. CBG: Recent Labs  Lab 01/03/24 1156 01/03/24 1639 01/03/24 2205 01/04/24 0259 01/04/24 0816  GLUCAP 203* 211* 224* 161* 167*   Lipid Profile: No results for input(s): CHOL, HDL, LDLCALC, TRIG, CHOLHDL, LDLDIRECT in the last 72 hours. Thyroid Function Tests: No results for input(s): TSH, T4TOTAL, FREET4, T3FREE, THYROIDAB in the last 72 hours. Anemia Panel: No results for input(s): VITAMINB12, FOLATE, FERRITIN, TIBC, IRON, RETICCTPCT in the last 72 hours. Sepsis Labs: Recent Labs  Lab 12/30/23 1854  LATICACIDVEN 1.5    Recent Results (from the past 240 hours)  Urine Culture     Status: None   Collection Time: 12/30/23  3:20 PM   Specimen: Urine, Clean Catch  Result Value Ref Range Status   Specimen Description   Final    URINE, CLEAN CATCH Performed at [facility], [address], [place], [place] [address]    Special Requests   Final    NONE Performed at [facility], [address], [place], [place] [address]    Culture   Final    NO GROWTH Performed at [facility], [address], [place], [place] [address]    Report Status 01/01/2024 FINAL  Final  Blood culture (routine x 2)     Status: Abnormal   Collection Time: 12/30/23  6:54 PM   Specimen: BLOOD  Result Value Ref Range Status   Specimen Description   Final    BLOOD RIGHT ANTECUBITAL Performed at [facility], [address], [place], [place] [address]    Special Requests   Final    BOTTLES DRAWN AEROBIC AND ANAEROBIC Blood Culture results may not be optimal due to an inadequate volume of blood received in culture bottles Performed at [facility], [address] [place], [place] [address]    Culture  Setup Time   Final    GRAM NEGATIVE RODS IN BOTH AEROBIC AND ANAEROBIC BOTTLES Organism ID to follow CRITICAL RESULT CALLED TO, READ BACK BY AND VERIFIED WITH:  EMILY S. PHARMD ON 12/31/2023 AT 0747 BY [facility] Performed at [facility], [address] [place], [place] [address]    Culture KLEBSIELLA PNEUMONIAE (A)  Final   Report Status 01/02/2024 FINAL  Final   Organism ID, Bacteria KLEBSIELLA PNEUMONIAE  Final      Susceptibility   Klebsiella pneumoniae - MIC*    AMPICILLIN RESISTANT Resistant     CEFAZOLIN (NON-URINE) 2 SENSITIVE Sensitive     CEFEPIME <=0.12 SENSITIVE Sensitive     ERTAPENEM <=0.12 SENSITIVE Sensitive     CEFTRIAXONE  <=0.25 SENSITIVE Sensitive     CIPROFLOXACIN <=0.06 SENSITIVE Sensitive     GENTAMICIN <=1 SENSITIVE Sensitive     MEROPENEM <=0.25 SENSITIVE Sensitive     TRIMETH /SULFA  <=20 SENSITIVE Sensitive  AMPICILLIN/SULBACTAM <=2 SENSITIVE Sensitive     PIP/TAZO Value in next row Sensitive      <=4 SENSITIVEThis is a modified FDA-approved test that has been validated and its performance characteristics determined by the reporting laboratory.  This laboratory is certified under the Clinical Laboratory Improvement Amendments CLIA as qualified to perform high complexity clinical laboratory testing.    * KLEBSIELLA PNEUMONIAE  Blood culture (routine x 2)     Status: Abnormal   Collection Time: 12/30/23  6:54 PM   Specimen: BLOOD  Result Value Ref Range Status   Specimen Description   Final    BLOOD LEFT ANTECUBITAL Performed at [facility], [address], [place], [place] [address]    Special Requests   Final    BOTTLES DRAWN AEROBIC AND ANAEROBIC Blood Culture results may not be optimal due to an inadequate volume of blood received in culture bottles Performed at [facility], [address], [place], [place] [address]    Culture  Setup Time   Final    GRAM NEGATIVE RODS IN BOTH AEROBIC AND ANAEROBIC BOTTLES CRITICAL VALUE NOTED.  VALUE IS CONSISTENT WITH PREVIOUSLY REPORTED AND CALLED VALUE. Performed at [facility], [address] [place], [place] [address]    Culture (A)   Final    KLEBSIELLA PNEUMONIAE SUSCEPTIBILITIES PERFORMED ON PREVIOUS CULTURE WITHIN THE LAST 5 DAYS. Performed at [facility], [address], [place], [place] [address]    Report Status 01/02/2024 FINAL  Final  Blood Culture ID Panel (Reflexed)     Status: Abnormal   Collection Time: 12/30/23  6:54 PM  Result Value Ref Range Status   Enterococcus faecalis NOT DETECTED NOT DETECTED Final   Enterococcus Faecium NOT DETECTED NOT DETECTED Final   Listeria monocytogenes NOT DETECTED NOT DETECTED Final   Staphylococcus species NOT DETECTED NOT DETECTED Final   Staphylococcus aureus (BCID) NOT DETECTED NOT DETECTED Final   Staphylococcus epidermidis NOT DETECTED NOT DETECTED Final   Staphylococcus lugdunensis NOT DETECTED NOT DETECTED Final   Streptococcus species NOT DETECTED NOT DETECTED Final   Streptococcus agalactiae NOT DETECTED NOT DETECTED Final   Streptococcus pneumoniae NOT DETECTED NOT DETECTED Final   Streptococcus pyogenes NOT DETECTED NOT DETECTED Final   A.calcoaceticus-baumannii NOT DETECTED NOT DETECTED Final   Bacteroides fragilis NOT DETECTED NOT DETECTED Final   Enterobacterales DETECTED (A) NOT DETECTED Final    Comment: Enterobacterales represent a large order of gram negative bacteria, not a single organism. CRITICAL RESULT CALLED TO, READ BACK BY AND VERIFIED WITH: EMILY S. PHARMD ON 12/31/2023 AT 0747 BY [facility]    Enterobacter cloacae complex NOT DETECTED NOT DETECTED Final   Escherichia coli NOT DETECTED NOT DETECTED Final   Klebsiella aerogenes NOT DETECTED NOT DETECTED Final   Klebsiella oxytoca NOT DETECTED NOT DETECTED Final   Klebsiella pneumoniae DETECTED (A) NOT DETECTED Final    Comment: CRITICAL RESULT CALLED TO, READ BACK BY AND VERIFIED WITH: EMILY S. PHARMD ON 12/31/2023 AT 0747 BY [facility]    Proteus species NOT DETECTED NOT DETECTED Final   Salmonella species NOT DETECTED NOT DETECTED Final   Serratia marcescens NOT DETECTED NOT DETECTED Final    Haemophilus influenzae NOT DETECTED NOT DETECTED Final   Neisseria meningitidis NOT DETECTED NOT DETECTED Final   Pseudomonas aeruginosa NOT DETECTED NOT DETECTED Final   Stenotrophomonas maltophilia NOT DETECTED NOT DETECTED Final   Candida albicans NOT DETECTED NOT DETECTED Final   Candida auris NOT DETECTED NOT DETECTED  Final   Candida glabrata NOT DETECTED NOT DETECTED Final   Candida krusei NOT DETECTED NOT DETECTED Final   Candida parapsilosis NOT DETECTED NOT DETECTED Final   Candida tropicalis NOT DETECTED NOT DETECTED Final   Cryptococcus neoformans/gattii NOT DETECTED NOT DETECTED Final   CTX-M ESBL NOT DETECTED NOT DETECTED Final   Carbapenem resistance IMP NOT DETECTED NOT DETECTED Final   Carbapenem resistance KPC NOT DETECTED NOT DETECTED Final   Carbapenem resistance NDM NOT DETECTED NOT DETECTED Final   Carbapenem resist OXA 48 LIKE NOT DETECTED NOT DETECTED Final   Carbapenem resistance VIM NOT DETECTED NOT DETECTED Final    Comment: Performed at [facility], [address] [place], [place] [address]         Radiology Studies: No results found.      Scheduled Meds:  amLODipine   10 mg Oral Daily   artificial tears  1 drop Both Eyes BID   atorvastatin   40 mg Oral Daily   cefadroxil   1,000 mg Oral BID   clopidogrel   75 mg Oral Daily   gabapentin   300 mg Oral Daily   heparin   5,000 Units Subcutaneous Q8H   insulin  aspart  0-5 Units Subcutaneous QHS   insulin  aspart  0-9 Units Subcutaneous TID WC   melatonin  5 mg Oral QHS   metoprolol  succinate  25 mg Oral Daily   QUEtiapine   25 mg Oral QHS   sodium chloride  flush  3 mL Intravenous Q12H   venlafaxine  XR  225 mg Oral Q breakfast   Continuous Infusions:   LOS: 4 days      Anthony CHRISTELLA Pouch, MD [facility] Pager [phone]  If 7PM-7AM, please contact night-coverage [email or web address] 01/04/2024, 9:59 AM   "

## 2024-01-05 DIAGNOSIS — R7881 Bacteremia: Secondary | ICD-10-CM | POA: Diagnosis not present

## 2024-01-05 LAB — GLUCOSE, CAPILLARY
Glucose-Capillary: 160 mg/dL — ABNORMAL HIGH (ref 70–99)
Glucose-Capillary: 205 mg/dL — ABNORMAL HIGH (ref 70–99)

## 2024-01-05 MED ORDER — CEFADROXIL 500 MG PO CAPS: 1000.0000 mg | ORAL_CAPSULE | Freq: Two times a day (BID) | ORAL | 0 refills | Status: AC

## 2024-01-05 NOTE — Discharge Summary (Signed)
 Physician Discharge Summary  Haley Mooney FMW:[id number] DOB: Jan 24, 1940 DOA: 12/30/2023  PCP: Agustina Greig NOVAK, MD  Admit date: 12/30/2023 Discharge date: 01/05/2024  Admitted From: home  Disposition:  home  Recommendations for Outpatient Follow-up:  Follow up with PCP in 1-2 weeks   Home Health: yes Equipment/Devices:  Discharge Condition: stable  CODE STATUS: DNR Diet recommendation: Heart Healthy / Carb Modified  Brief/Interim Summary: HPI was taken from Dr. Fernand: Haley Mooney is a 83 y.o. year old female with medical history of hypertension, hyperlipidemia, type 2 diabetes, GERD, bipolar disorder presenting to the ED from nursing facility for confusion and hypotension.   Per facility pt was confused this morning 9 am.  They stated she was lapsed in her bed but did not have a fall.  This prompted him to calling EMS and they noted her blood pressure was low which resulted in her coming to the ED.  Over the last few days she has not had any complaints.   Pt states she has not been eating well over the last 4 days. She just feels like she has no appetite. No n/v. States has malaise. No dysuria. Has increased frequency.      On arrival to the ED patient was noted to be HDS stable. Lab work obtained.  CBC without leukocytosis, mild anemia near her baseline, mild thrombocytopenia.  CMP with mild hyponatremia, normal potassium, significant elevation in her renal function consistent with AKI.  Mild decrease in her protein and albumin levels and slight elevation in her ALP levels.  UA consistent with UTI.  Lactic acid normal.  CT head without any acute findings but did show chronic ischemic changes with chronic lacunar infarct.   TR[facility] contacted for admission.  Discharge Diagnoses:  Principal Problem:   AKI (acute kidney injury) Active Problems:   Essential hypertension   Hyperlipidemia   GERD (gastroesophageal reflux disease)   GAD (generalized anxiety disorder)   Bipolar disease,  chronic (HCC) Bacteremia: blood cxs growing klebsiella. Continue on cefadroxil . Repeat blood cxs NGTD   AKI: likely prerenal. Resolved   Possible UTI: urine cx showed no growth. Continue on cefadroxil     Hypokalemia: WNL today    HTN: continue on amlodipine , metoprolol     HLD: continue on statin    DM2: well controlled, HbA1c 6.4. Restart home anti-DMs meds at d/c   Peripheral neuropathy: continue on home dose of gabapentin     Bipolar disorder: severity unknown. Continue on home dose of seroquel    MDD/GAD: severity unknown. Continue on venlafaxine     Chronic pain: no longer taking tramadol  as per med rec. Tylenol  prn    Discharge Instructions  Discharge Instructions     Diet - low sodium heart healthy   Complete by: As directed    Discharge instructions   Complete by: As directed    F/u w/ PCP in 1-2 weeks.   Increase activity slowly   Complete by: As directed       Allergies as of 01/05/2024       Reactions   Ace Inhibitors Cough   Other reaction(s): OTHER   Isosorbide Other (See Comments)   Other reaction(s): Other (See Comments) cough   Tetracyclines & Related Rash        Medication List     TAKE these medications    atorvastatin  40 MG tablet Commonly known as: LIPITOR Take 40 mg by mouth daily. What changed: Another medication with the same name was removed. Continue taking this medication, and follow  the directions you see here.   cefadroxil  500 MG capsule Commonly known as: DURICEF Take 2 capsules (1,000 mg total) by mouth 2 (two) times daily for 2 days.   clopidogrel  75 MG tablet Commonly known as: PLAVIX  Take 1 tablet (75 mg total) by mouth daily.   gabapentin  300 MG capsule Commonly known as: NEURONTIN  Take 1 capsule by mouth daily.   hydrochlorothiazide  25 MG tablet Commonly known as: HYDRODIURIL  Take 25 mg by mouth daily.   insulin  detemir 100 UNIT/ML injection Commonly known as: LEVEMIR  Inject 20 Units into the skin 2 (two)  times daily.   Lantus SoloStar 100 UNIT/ML Solostar Pen Generic drug: insulin  glargine Inject into the skin.   metoprolol  succinate 25 MG 24 hr tablet Commonly known as: TOPROL -XL Take 25 mg by mouth daily. Take with or immediately following a meal.   nitroGLYCERIN  0.4 MG SL tablet Commonly known as: NITROSTAT  Place 0.4 mg under the tongue every 5 (five) minutes as needed for chest pain.   nystatin -triamcinolone  cream Commonly known as: MYCOLOG II Apply to affected area daily   Olopatadine  HCl 0.2 % Soln 1 drop to affected eye daily.   Ozempic (1 MG/DOSE) 4 MG/3ML Sopn Generic drug: Semaglutide (1 MG/DOSE) What changed: Another medication with the same name was removed. Continue taking this medication, and follow the directions you see here.   QUEtiapine  25 MG tablet Commonly known as: SEROQUEL  Take 25 mg by mouth at bedtime.   venlafaxine  XR 75 MG 24 hr capsule Commonly known as: EFFEXOR -XR Take 225 mg by mouth daily.        Allergies[1]  Consultations:    Procedures/Studies: CT Head Wo Contrast Result Date: 12/30/2023 CLINICAL DATA:  Altered mental status EXAM: CT HEAD WITHOUT CONTRAST TECHNIQUE: Contiguous axial images were obtained from the base of the skull through the vertex without intravenous contrast. RADIATION DOSE REDUCTION: This exam was performed according to the departmental dose-optimization program which includes automated exposure control, adjustment of the mA and/or kV according to patient size and/or use of iterative reconstruction technique. COMPARISON:  CT 11/11/2023, 11/13/2022 FINDINGS: Brain: No acute territorial infarction, hemorrhage or intracranial mass. Atrophy and mild chronic small vessel ischemic changes of the white matter. Stable ventricle size. Chronic lacunar infarct in the pons Vascular: No hyperdense vessels. Vertebral and carotid vascular calcification Skull: Normal. Negative for fracture or focal lesion. Sinuses/Orbits: No acute  finding. Other: None IMPRESSION: 1. No CT evidence for acute intracranial abnormality. 2. Atrophy and chronic small vessel ischemic changes of the white matter. 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 Electronically Signed   By: Luke Bun M.D.   On: 12/30/2023 17:42   DG Shoulder Left Result Date: 12/30/2023 CLINICAL DATA:  Fall shoulder pain EXAM: LEFT SHOULDER - 2+ VIEW COMPARISON:  None Available. FINDINGS: Moderate AC joint degenerative change. No fracture or malalignment. Calcific tendinopathy. IMPRESSION: No acute osseous abnormality. Electronically Signed   By: Luke Bun M.D.   On: 12/30/2023 17:39   (Echo, Carotid, EGD, Colonoscopy, ERCP)    Subjective: Pt c/o fatigue    Discharge Exam: Vitals:   01/05/24 0420 01/05/24 0738  BP: 114/78 119/68  Pulse: 68 81  Resp: 18 17  Temp: 97.8 F (36.6 C) 98.3 F (36.8 C)  SpO2: 99% 100%   Vitals:   01/04/24 0814 01/04/24 2110 01/05/24 0420 01/05/24 0738  BP: 136/72 124/63 114/78 119/68  Pulse: 77 76 68 81  Resp: 18 18 18 17   Temp: 97.6 F (36.4 C) 97.8 F (36.6  C) 97.8 F (36.6 C) 98.3 F (36.8 C)  TempSrc: Oral Oral Oral   SpO2: 98% 100% 99% 100%  Weight:      Height:        General: Pt is alert, awake, not in acute distress Cardiovascular: S1/S2 +, no rubs, no gallops Respiratory: CTA bilaterally, no wheezing, no rhonchi Abdominal: Soft, NT, ND, bowel sounds + Extremities: no cyanosis    The results of significant diagnostics from this hospitalization (including imaging, microbiology, ancillary and laboratory) are listed below for reference.     Microbiology: Recent Results (from the past 240 hours)  Urine Culture     Status: None   Collection Time: 12/30/23  3:20 PM   Specimen: Urine, Clean Catch  Result Value Ref Range Status   Specimen Description   Final    URINE, CLEAN CATCH Performed at [facility], [address], [place], [place] [address]    Special Requests   Final     NONE Performed at [facility] [address] [place], [place] [address]    Culture   Final    NO GROWTH Performed at [facility], [address] [place] [address], [place], [place] [address]    Report Status 01/01/2024 FINAL  Final  Blood culture (routine x 2)     Status: Abnormal   Collection Time: 12/30/23  6:54 PM   Specimen: BLOOD  Result Value Ref Range Status   Specimen Description   Final    BLOOD RIGHT ANTECUBITAL Performed at [facility], [address], [place], [place] [address]    Special Requests   Final    BOTTLES DRAWN AEROBIC AND ANAEROBIC Blood Culture results may not be optimal due to an inadequate volume of blood received in culture bottles Performed at [facility], [address], [place], [place] [address]    Culture  Setup Time   Final    GRAM NEGATIVE RODS IN BOTH AEROBIC AND ANAEROBIC BOTTLES Organism ID to follow CRITICAL RESULT CALLED TO, READ BACK BY AND VERIFIED WITH: EMILY S. PHARMD ON 12/31/2023 AT 0747 BY [facility] Performed at [facility], [address] [place], [place] [address]    Culture KLEBSIELLA PNEUMONIAE (A)  Final   Report Status 01/02/2024 FINAL  Final   Organism ID, Bacteria KLEBSIELLA PNEUMONIAE  Final      Susceptibility   Klebsiella pneumoniae - MIC*    AMPICILLIN RESISTANT Resistant     CEFAZOLIN (NON-URINE) 2 SENSITIVE Sensitive     CEFEPIME <=0.12 SENSITIVE Sensitive     ERTAPENEM <=0.12 SENSITIVE Sensitive     CEFTRIAXONE  <=0.25 SENSITIVE Sensitive     CIPROFLOXACIN <=0.06 SENSITIVE Sensitive     GENTAMICIN <=1 SENSITIVE Sensitive     MEROPENEM <=0.25 SENSITIVE Sensitive     TRIMETH /SULFA  <=20 SENSITIVE Sensitive     AMPICILLIN/SULBACTAM <=2 SENSITIVE Sensitive     PIP/TAZO Value in next row Sensitive      <=4 SENSITIVEThis is a modified FDA-approved test that has been validated and its performance characteristics determined by the reporting laboratory.  This laboratory is certified under the Clinical  Laboratory Improvement Amendments CLIA as qualified to perform high complexity clinical laboratory testing.    * KLEBSIELLA PNEUMONIAE  Blood culture (routine x 2)     Status: Abnormal   Collection Time: 12/30/23  6:54 PM   Specimen: BLOOD  Result Value Ref Range Status   Specimen Description   Final    BLOOD LEFT ANTECUBITAL Performed  at [facility], [address] [place], [place] [address]    Special Requests   Final    BOTTLES DRAWN AEROBIC AND ANAEROBIC Blood Culture results may not be optimal due to an inadequate volume of blood received in culture bottles Performed at [facility], [address] [place], [place] [address]    Culture  Setup Time   Final    GRAM NEGATIVE RODS IN BOTH AEROBIC AND ANAEROBIC BOTTLES CRITICAL VALUE NOTED.  VALUE IS CONSISTENT WITH PREVIOUSLY REPORTED AND CALLED VALUE. Performed at [facility], [address] [place], [place] [address]    Culture (A)  Final    KLEBSIELLA PNEUMONIAE SUSCEPTIBILITIES PERFORMED ON PREVIOUS CULTURE WITHIN THE LAST 5 DAYS. Performed at [facility], [address], [place], [place] [address]    Report Status 01/02/2024 FINAL  Final  Blood Culture ID Panel (Reflexed)     Status: Abnormal   Collection Time: 12/30/23  6:54 PM  Result Value Ref Range Status   Enterococcus faecalis NOT DETECTED NOT DETECTED Final   Enterococcus Faecium NOT DETECTED NOT DETECTED Final   Listeria monocytogenes NOT DETECTED NOT DETECTED Final   Staphylococcus species NOT DETECTED NOT DETECTED Final   Staphylococcus aureus (BCID) NOT DETECTED NOT DETECTED Final   Staphylococcus epidermidis NOT DETECTED NOT DETECTED Final   Staphylococcus lugdunensis NOT DETECTED NOT DETECTED Final   Streptococcus species NOT DETECTED NOT DETECTED Final   Streptococcus agalactiae NOT DETECTED NOT DETECTED Final   Streptococcus pneumoniae NOT DETECTED NOT DETECTED Final   Streptococcus pyogenes NOT DETECTED NOT DETECTED Final    A.calcoaceticus-baumannii NOT DETECTED NOT DETECTED Final   Bacteroides fragilis NOT DETECTED NOT DETECTED Final   Enterobacterales DETECTED (A) NOT DETECTED Final    Comment: Enterobacterales represent a large order of gram negative bacteria, not a single organism. CRITICAL RESULT CALLED TO, READ BACK BY AND VERIFIED WITH: EMILY S. PHARMD ON 12/31/2023 AT 0747 BY [facility]    Enterobacter cloacae complex NOT DETECTED NOT DETECTED Final   Escherichia coli NOT DETECTED NOT DETECTED Final   Klebsiella aerogenes NOT DETECTED NOT DETECTED Final   Klebsiella oxytoca NOT DETECTED NOT DETECTED Final   Klebsiella pneumoniae DETECTED (A) NOT DETECTED Final    Comment: CRITICAL RESULT CALLED TO, READ BACK BY AND VERIFIED WITH: EMILY S. PHARMD ON 12/31/2023 AT 0747 BY KC    Proteus species NOT DETECTED NOT DETECTED Final   Salmonella species NOT DETECTED NOT DETECTED Final   Serratia marcescens NOT DETECTED NOT DETECTED Final   Haemophilus influenzae NOT DETECTED NOT DETECTED Final   Neisseria meningitidis NOT DETECTED NOT DETECTED Final   Pseudomonas aeruginosa NOT DETECTED NOT DETECTED Final   Stenotrophomonas maltophilia NOT DETECTED NOT DETECTED Final   Candida albicans NOT DETECTED NOT DETECTED Final   Candida auris NOT DETECTED NOT DETECTED Final   Candida glabrata NOT DETECTED NOT DETECTED Final   Candida krusei NOT DETECTED NOT DETECTED Final   Candida parapsilosis NOT DETECTED NOT DETECTED Final   Candida tropicalis NOT DETECTED NOT DETECTED Final   Cryptococcus neoformans/gattii NOT DETECTED NOT DETECTED Final   CTX-M ESBL NOT DETECTED NOT DETECTED Final   Carbapenem resistance IMP NOT DETECTED NOT DETECTED Final   Carbapenem resistance KPC NOT DETECTED NOT DETECTED Final   Carbapenem resistance NDM NOT DETECTED NOT DETECTED Final   Carbapenem resist OXA 48 LIKE NOT DETECTED NOT DETECTED Final   Carbapenem resistance VIM NOT DETECTED NOT DETECTED Final    Comment:  Performed at [facility], [address] [place], [place] [address]  Culture, blood (Routine X 2) w Reflex to ID Panel     Status: None (Preliminary result)   Collection Time: 01/04/24  2:25 PM   Specimen: BLOOD  Result Value Ref Range Status   Specimen Description BLOOD RIGHT ANTECUBITAL  Final   Special Requests   Final    BOTTLES DRAWN AEROBIC AND ANAEROBIC Blood Culture adequate volume   Culture   Final    NO GROWTH < 24 HOURS Performed at [facility], [address] [place] [address], [place], [place] [address]    Report Status PENDING  Incomplete  Culture, blood (Routine X 2) w Reflex to ID Panel     Status: None (Preliminary result)   Collection Time: 01/04/24  2:38 PM   Specimen: BLOOD  Result Value Ref Range Status   Specimen Description BLOOD BLOOD LEFT ARM  Final   Special Requests   Final    BOTTLES DRAWN AEROBIC AND ANAEROBIC Blood Culture adequate volume   Culture   Final    NO GROWTH < 24 HOURS Performed at [facility], [address] [place], [place] [address]    Report Status PENDING  Incomplete     Labs: BNP (last 3 results) No results for input(s): BNP in the last 8760 hours. Basic Metabolic Panel: Recent Labs  Lab 12/30/23 1446 12/31/23 0439 01/01/24 0425 01/03/24 0456 01/04/24 0421  NA 133* 136 137 136 135  K 3.5 3.3* 3.8 2.7* 3.5  CL 97* 101 103 99 101  CO2 22 20* 23 25 23   GLUCOSE 112* 128* 149* 110* 219*  BUN 61* 45* 26* 12 11  CREATININE 2.32* 1.36* 0.90 0.69 0.61  CALCIUM  9.0 8.8* 8.7* 8.5* 8.3*  MG 2.2  --   --   --   --    Liver Function Tests: Recent Labs  Lab 12/30/23 1446  AST 27  ALT 24  ALKPHOS 147*  BILITOT 0.8  PROT 6.4*  ALBUMIN 3.3*   No results for input(s): LIPASE, AMYLASE in the last 168 hours. No results for input(s): AMMONIA in the last 168 hours. CBC: Recent Labs  Lab 12/30/23 1446 12/31/23 0439 01/01/24 0425 01/03/24 0456  WBC 10.5 10.3 8.5 9.2  NEUTROABS  --   --  6.3 5.9  HGB 10.9* 10.7* 10.5* 10.3*   HCT 32.8* 30.8* 31.0* 30.2*  MCV 84.5 82.6 84.5 82.3  PLT 100* 102* 102* 148*   Cardiac Enzymes: No results for input(s): CKTOTAL, CKMB, CKMBINDEX, TROPONINI in the last 168 hours. BNP: Invalid input(s): POCBNP CBG: Recent Labs  Lab 01/04/24 1552 01/04/24 1558 01/04/24 2107 01/05/24 0739 01/05/24 1155  GLUCAP 280* 282* 124* 160* 205*   D-Dimer No results for input(s): DDIMER in the last 72 hours. Hgb A1c No results for input(s): HGBA1C in the last 72 hours. Lipid Profile No results for input(s): CHOL, HDL, LDLCALC, TRIG, CHOLHDL, LDLDIRECT in the last 72 hours. Thyroid function studies No results for input(s): TSH, T4TOTAL, T3FREE, THYROIDAB in the last 72 hours.  Invalid input(s): FREET3 Anemia work up No results for input(s): VITAMINB12, FOLATE, FERRITIN, TIBC, IRON, RETICCTPCT in the last 72 hours. Urinalysis    Component Value Date/Time   COLORURINE AMBER (A) 12/30/2023 1520   APPEARANCEUR CLOUDY (A) 12/30/2023 1520   APPEARANCEUR CLOUDY 01/20/2014 1003   LABSPEC 1.016 12/30/2023 1520   LABSPEC 1.020 01/20/2014 1003   PHURINE 5.0 12/30/2023 1520   GLUCOSEU NEGATIVE 12/30/2023 1520  GLUCOSEU 500 mg/dL 98/85/7983 8996   HGBUR MODERATE (A) 12/30/2023 1520   BILIRUBINUR NEGATIVE 12/30/2023 1520   BILIRUBINUR NEGATIVE 01/20/2014 1003   KETONESUR NEGATIVE 12/30/2023 1520   PROTEINUR 100 (A) 12/30/2023 1520   NITRITE NEGATIVE 12/30/2023 1520   LEUKOCYTESUR LARGE (A) 12/30/2023 1520   LEUKOCYTESUR TRACE 01/20/2014 1003   Sepsis Labs Recent Labs  Lab 12/30/23 1446 12/31/23 0439 01/01/24 0425 01/03/24 0456  WBC 10.5 10.3 8.5 9.2   Microbiology Recent Results (from the past 240 hours)  Urine Culture     Status: None   Collection Time: 12/30/23  3:20 PM   Specimen: Urine, Clean Catch  Result Value Ref Range Status   Specimen Description   Final    URINE, CLEAN CATCH Performed at [facility], [address], [place], [place] [address]    Special Requests   Final    NONE Performed at [facility], [address] [place], [place] [address]    Culture   Final    NO GROWTH Performed at [facility], [address], [place], [place] [address]    Report Status 01/01/2024 FINAL  Final  Blood culture (routine x 2)     Status: Abnormal   Collection Time: 12/30/23  6:54 PM   Specimen: BLOOD  Result Value Ref Range Status   Specimen Description   Final    BLOOD RIGHT ANTECUBITAL Performed at [facility], [address], [place], [place] [address]    Special Requests   Final    BOTTLES DRAWN AEROBIC AND ANAEROBIC Blood Culture results may not be optimal due to an inadequate volume of blood received in culture bottles Performed at [facility], [address], [place], [place] [address]    Culture  Setup Time   Final    GRAM NEGATIVE RODS IN BOTH AEROBIC AND ANAEROBIC BOTTLES Organism ID to follow CRITICAL RESULT CALLED TO, READ BACK BY AND VERIFIED WITH: EMILY S. PHARMD ON 12/31/2023 AT 0747 BY [facility] Performed at [facility], [address] [place], [place] [address]    Culture KLEBSIELLA PNEUMONIAE (A)  Final   Report Status 01/02/2024 FINAL  Final   Organism ID, Bacteria KLEBSIELLA PNEUMONIAE  Final      Susceptibility   Klebsiella pneumoniae - MIC*    AMPICILLIN RESISTANT Resistant     CEFAZOLIN (NON-URINE) 2 SENSITIVE Sensitive     CEFEPIME <=0.12 SENSITIVE Sensitive     ERTAPENEM <=0.12 SENSITIVE Sensitive     CEFTRIAXONE  <=0.25 SENSITIVE Sensitive     CIPROFLOXACIN <=0.06 SENSITIVE Sensitive     GENTAMICIN <=1 SENSITIVE Sensitive     MEROPENEM <=0.25 SENSITIVE Sensitive     TRIMETH /SULFA  <=20 SENSITIVE Sensitive     AMPICILLIN/SULBACTAM <=2 SENSITIVE Sensitive     PIP/TAZO Value in next row Sensitive      <=4 SENSITIVEThis is a modified FDA-approved test that has been validated and its performance characteristics determined by the  reporting laboratory.  This laboratory is certified under the Clinical Laboratory Improvement Amendments CLIA as qualified to perform high complexity clinical laboratory testing.    * KLEBSIELLA PNEUMONIAE  Blood culture (routine x 2)     Status: Abnormal   Collection Time: 12/30/23  6:54 PM   Specimen: BLOOD  Result Value Ref Range Status   Specimen Description   Final    BLOOD LEFT ANTECUBITAL Performed at [facility], [address], [place], [place] [address]  Special Requests   Final    BOTTLES DRAWN AEROBIC AND ANAEROBIC Blood Culture results may not be optimal due to an inadequate volume of blood received in culture bottles Performed at [facility], [address], [place], [place] [address]    Culture  Setup Time   Final    GRAM NEGATIVE RODS IN BOTH AEROBIC AND ANAEROBIC BOTTLES CRITICAL VALUE NOTED.  VALUE IS CONSISTENT WITH PREVIOUSLY REPORTED AND CALLED VALUE. Performed at [facility], [address] [place], [place] [address]    Culture (A)  Final    KLEBSIELLA PNEUMONIAE SUSCEPTIBILITIES PERFORMED ON PREVIOUS CULTURE WITHIN THE LAST 5 DAYS. Performed at [facility], [address], [place], [place] [address]    Report Status 01/02/2024 FINAL  Final  Blood Culture ID Panel (Reflexed)     Status: Abnormal   Collection Time: 12/30/23  6:54 PM  Result Value Ref Range Status   Enterococcus faecalis NOT DETECTED NOT DETECTED Final   Enterococcus Faecium NOT DETECTED NOT DETECTED Final   Listeria monocytogenes NOT DETECTED NOT DETECTED Final   Staphylococcus species NOT DETECTED NOT DETECTED Final   Staphylococcus aureus (BCID) NOT DETECTED NOT DETECTED Final   Staphylococcus epidermidis NOT DETECTED NOT DETECTED Final   Staphylococcus lugdunensis NOT DETECTED NOT DETECTED Final   Streptococcus species NOT DETECTED NOT DETECTED Final   Streptococcus agalactiae NOT DETECTED NOT DETECTED Final   Streptococcus pneumoniae NOT DETECTED NOT  DETECTED Final   Streptococcus pyogenes NOT DETECTED NOT DETECTED Final   A.calcoaceticus-baumannii NOT DETECTED NOT DETECTED Final   Bacteroides fragilis NOT DETECTED NOT DETECTED Final   Enterobacterales DETECTED (A) NOT DETECTED Final    Comment: Enterobacterales represent a large order of gram negative bacteria, not a single organism. CRITICAL RESULT CALLED TO, READ BACK BY AND VERIFIED WITH: EMILY S. PHARMD ON 12/31/2023 AT 0747 BY [facility]    Enterobacter cloacae complex NOT DETECTED NOT DETECTED Final   Escherichia coli NOT DETECTED NOT DETECTED Final   Klebsiella aerogenes NOT DETECTED NOT DETECTED Final   Klebsiella oxytoca NOT DETECTED NOT DETECTED Final   Klebsiella pneumoniae DETECTED (A) NOT DETECTED Final    Comment: CRITICAL RESULT CALLED TO, READ BACK BY AND VERIFIED WITH: EMILY S. PHARMD ON 12/31/2023 AT 0747 BY [facility]    Proteus species NOT DETECTED NOT DETECTED Final   Salmonella species NOT DETECTED NOT DETECTED Final   Serratia marcescens NOT DETECTED NOT DETECTED Final   Haemophilus influenzae NOT DETECTED NOT DETECTED Final   Neisseria meningitidis NOT DETECTED NOT DETECTED Final   Pseudomonas aeruginosa NOT DETECTED NOT DETECTED Final   Stenotrophomonas maltophilia NOT DETECTED NOT DETECTED Final   Candida albicans NOT DETECTED NOT DETECTED Final   Candida auris NOT DETECTED NOT DETECTED Final   Candida glabrata NOT DETECTED NOT DETECTED Final   Candida krusei NOT DETECTED NOT DETECTED Final   Candida parapsilosis NOT DETECTED NOT DETECTED Final   Candida tropicalis NOT DETECTED NOT DETECTED Final   Cryptococcus neoformans/gattii NOT DETECTED NOT DETECTED Final   CTX-M ESBL NOT DETECTED NOT DETECTED Final   Carbapenem resistance IMP NOT DETECTED NOT DETECTED Final   Carbapenem resistance KPC NOT DETECTED NOT DETECTED Final   Carbapenem resistance NDM NOT DETECTED NOT DETECTED Final   Carbapenem resist OXA 48 LIKE NOT DETECTED NOT DETECTED Final   Carbapenem  resistance VIM NOT DETECTED NOT DETECTED Final    Comment: Performed at [facility], [address], [place], [place] [address]  Culture,  blood (Routine X 2) w Reflex to ID Panel     Status: None (Preliminary result)   Collection Time: 01/04/24  2:25 PM   Specimen: BLOOD  Result Value Ref Range Status   Specimen Description BLOOD RIGHT ANTECUBITAL  Final   Special Requests   Final    BOTTLES DRAWN AEROBIC AND ANAEROBIC Blood Culture adequate volume   Culture   Final    NO GROWTH < 24 HOURS Performed at [facility], [address] [place], [place] [address]    Report Status PENDING  Incomplete  Culture, blood (Routine X 2) w Reflex to ID Panel     Status: None (Preliminary result)   Collection Time: 01/04/24  2:38 PM   Specimen: BLOOD  Result Value Ref Range Status   Specimen Description BLOOD BLOOD LEFT ARM  Final   Special Requests   Final    BOTTLES DRAWN AEROBIC AND ANAEROBIC Blood Culture adequate volume   Culture   Final    NO GROWTH < 24 HOURS Performed at [facility], [address] [place], [place] [address]    Report Status PENDING  Incomplete     Time coordinating discharge: 36 minutes  SIGNED:   Anthony CHRISTELLA Pouch, MD  [facility] 01/05/2024, 2:09 PM Pager   If 7PM-7AM, please contact night-coverage [email or web address]     [1]  Allergies Allergen Reactions   Ace Inhibitors Cough    Other reaction(s): OTHER   Isosorbide Other (See Comments)    Other reaction(s): Other (See Comments)  cough   Tetracyclines & Related Rash

## 2024-01-05 NOTE — NC FL2 (Signed)
 " [place]  MEDICAID [id number] LEVEL OF CARE FORM     IDENTIFICATION  Patient Name: Haley Mooney Birthdate: 1940/09/23 Sex: female Admission Date (Current Location): 12/30/2023  [facility] and [place] Number:  Chiropodist and Address:         Provider Number: [id number]  Attending Physician Name and Address:  Trudy Anthony HERO, MD  Relative Name and Phone Number:       Current Level of Care: Hospital Recommended Level of Care: Assisted Living Facility Prior Approval Number:    Date Approved/Denied:   PASRR Number: [id number] E Expired 09/21/2017  Discharge Plan: Other (Comment) (ALF)    Current Diagnoses: Patient Active Problem List   Diagnosis Date Noted   AKI (acute kidney injury) 12/30/2023   Essential hypertension 12/30/2023   Hyperlipidemia 12/30/2023   GERD (gastroesophageal reflux disease) 12/30/2023   GAD (generalized anxiety disorder) 12/30/2023   Bipolar disease, chronic (HCC) 12/30/2023   CVA (cerebral vascular accident) (HCC) 08/21/2017    Orientation RESPIRATION BLADDER Height & Weight     Time, Situation  Normal Continent Weight: 76.6 kg Height:  5' 5 (165.1 cm)  BEHAVIORAL SYMPTOMS/MOOD NEUROLOGICAL BOWEL NUTRITION STATUS      Incontinent Diet (low sodium heart healthy)  AMBULATORY STATUS COMMUNICATION OF NEEDS Skin   Limited Assist Verbally Bruising                       Personal Care Assistance Level of Assistance              Functional Limitations Info             SPECIAL CARE FACTORS FREQUENCY  PT (By licensed PT), OT (By licensed OT)     PT Frequency: [facility] OT Frequency: [facility]            Contractures Contractures Info: Not present    Additional Factors Info  Code Status, Allergies Code Status Info: DNR Allergies Info: Ace Inhibitors, Isosorbide, Tetracyclines & Related           TAKE these medications     atorvastatin  40 MG tablet Commonly known as: LIPITOR Take 40 mg by mouth  daily. What changed: Another medication with the same name was removed. Continue taking this medication, and follow the directions you see here.    cefadroxil  500 MG capsule Commonly known as: DURICEF Take 2 capsules (1,000 mg total) by mouth 2 (two) times daily for 2 days.    clopidogrel  75 MG tablet Commonly known as: PLAVIX  Take 1 tablet (75 mg total) by mouth daily.    gabapentin  300 MG capsule Commonly known as: NEURONTIN  Take 1 capsule by mouth daily.    hydrochlorothiazide  25 MG tablet Commonly known as: HYDRODIURIL  Take 25 mg by mouth daily.    insulin  detemir 100 UNIT/ML injection Commonly known as: LEVEMIR  Inject 20 Units into the skin 2 (two) times daily.    Lantus SoloStar 100 UNIT/ML Solostar Pen Generic drug: insulin  glargine Inject into the skin.    metoprolol  succinate 25 MG 24 hr tablet Commonly known as: TOPROL -XL Take 25 mg by mouth daily. Take with or immediately following a meal.    nitroGLYCERIN  0.4 MG SL tablet Commonly known as: NITROSTAT  Place 0.4 mg under the tongue every 5 (five) minutes as needed for chest pain.    nystatin -triamcinolone  cream Commonly known as: MYCOLOG II Apply to affected area daily    Olopatadine  HCl 0.2 % Soln 1 drop to affected eye daily.  Ozempic (1 MG/DOSE) 4 MG/3ML Sopn Generic drug: Semaglutide (1 MG/DOSE) What changed: Another medication with the same name was removed. Continue taking this medication, and follow the directions you see here.    QUEtiapine  25 MG tablet Commonly known as: SEROQUEL  Take 25 mg by mouth at bedtime.    venlafaxine  XR 75 MG 24 hr capsule Commonly known as: EFFEXOR -XR Take 225 mg by mouth daily.   Relevant Imaging Results:  Relevant Lab Results:   Additional Information    Corean ONEIDA Haddock, RN     "

## 2024-01-05 NOTE — TOC Transition Note (Signed)
 Transition of Care [facility] - Discharge Note   Patient Details  Name: Haley Mooney MRN: [id number] Date of Birth: 1940/10/02  Transition of Care [facility]) CM/SW Contact:  Corean ONEIDA Haddock, RN Phone Number: 01/05/2024, 2:40 PM   Clinical Narrative:      Spoke with Keta at [facility].  She confirms patient can return home today Per Keta patient will have [facility] therapy onsite   Patient will DC to: [facility] Anticipated DC date: 01/05/2024  Family notified: Daughter Dorothyann Transport by: Son in social worker  Per MD patient ready for DC to . RN, patient, patient's family, and facility notified of DC. Discharge Summary, fl2 and therapy notes sent to facility. RN given number for report. r  TOC signing off.        Patient Goals and CMS Choice            Discharge Placement                       Discharge Plan and Services Additional resources added to the After Visit Summary for                                       Social Drivers of Health (SDOH) Interventions SDOH Screenings   Food Insecurity: Patient Unable To Answer (12/31/2023)  Housing: Low Risk (01/02/2024)  Transportation Needs: Patient Unable To Answer (12/31/2023)  Utilities: Patient Unable To Answer (12/31/2023)  Financial Resource Strain: Low Risk (02/25/2023)   Received from [facility]  Physical Activity: Inactive (05/27/2023)   Received from [facility]  Social Connections: Patient Unable To Answer (12/31/2023)  Stress: No Stress Concern Present (05/27/2023)   Received from [facility]  Tobacco Use: High Risk (12/30/2023)  Health Literacy: Medium Risk (02/25/2023)   Received from [facility]     Readmission Risk Interventions     No data to display

## 2024-01-05 NOTE — Progress Notes (Signed)
 Mobility Specialist - Progress Note   01/05/24 0902  Mobility  Activity Dangled on edge of bed;Pivoted/transferred from bed to chair  Level of Assistance Contact guard assist, steadying assist  Assistive Device None (HHA)  Distance Ambulated (ft) 2 ft  Activity Response Tolerated well  Mobility visit 1 Mobility  Mobility Specialist Start Time (ACUTE ONLY) 0850  Mobility Specialist Stop Time (ACUTE ONLY) 0859  Mobility Specialist Time Calculation (min) (ACUTE ONLY) 9 min   Pt dangling EOB upon entry, utilizing RA. Pt required CGA to stand and transfer to the recliner via SPT, tolerated well--- no AD. Pt left seated in the recliner with alarm set and need within reach.  America Silvan Mobility Specialist 01/05/2024 9:04 AM

## 2024-01-05 NOTE — Plan of Care (Signed)

## 2024-01-05 NOTE — TOC Progression Note (Signed)
 Transition of Care [facility]) - Progression Note    Patient Details  Name: Haley Mooney MRN: [id number] Date of Birth: [birth year]  Transition of [facility]) CM/SW Contact  Corean ONEIDA Haddock, RN Phone Number: 01/05/2024, 12:01 PM  Clinical Narrative:          Message left for [facility] to review mobility and confirm patient can return at discharge                Expected Discharge Plan and Services                                               Social Drivers of Health (SDOH) Interventions SDOH Screenings   Food Insecurity: Patient Unable To Answer (12/31/2023)  Housing: Low Risk (01/02/2024)  Transportation Needs: Patient Unable To Answer (12/31/2023)  Utilities: Patient Unable To Answer (12/31/2023)  Financial Resource Strain: Low Risk (02/25/2023)   Received from [facility]  Physical Activity: Inactive (05/27/2023)   Received from [facility]  Social Connections: Patient Unable To Answer (12/31/2023)  Stress: No Stress Concern Present (05/27/2023)   Received from [facility]  Tobacco Use: High Risk (12/30/2023)  Health Literacy: Medium Risk (02/25/2023)   Received from [facility]    Readmission Risk Interventions     No data to display

## 2024-01-09 LAB — CULTURE, BLOOD (ROUTINE X 2)
Culture: NO GROWTH
Culture: NO GROWTH
Special Requests: ADEQUATE
Special Requests: ADEQUATE

## 2024-02-04 ENCOUNTER — Emergency Department

## 2024-02-04 ENCOUNTER — Other Ambulatory Visit: Payer: Self-pay

## 2024-02-04 ENCOUNTER — Observation Stay
Admission: EM | Admit: 2024-02-04 | Discharge: 2024-02-06 | Disposition: A | Discharge status: Home Health | Attending: Emergency Medicine | Admitting: Emergency Medicine

## 2024-02-04 DIAGNOSIS — E119 Type 2 diabetes mellitus without complications: Secondary | ICD-10-CM

## 2024-02-04 DIAGNOSIS — I2510 Atherosclerotic heart disease of native coronary artery without angina pectoris: Secondary | ICD-10-CM | POA: Insufficient documentation

## 2024-02-04 DIAGNOSIS — R2681 Unsteadiness on feet: Secondary | ICD-10-CM | POA: Insufficient documentation

## 2024-02-04 DIAGNOSIS — F0390 Unspecified dementia without behavioral disturbance: Secondary | ICD-10-CM | POA: Insufficient documentation

## 2024-02-04 DIAGNOSIS — F411 Generalized anxiety disorder: Secondary | ICD-10-CM | POA: Diagnosis present

## 2024-02-04 DIAGNOSIS — M6281 Muscle weakness (generalized): Secondary | ICD-10-CM | POA: Insufficient documentation

## 2024-02-04 DIAGNOSIS — Z8673 Personal history of transient ischemic attack (TIA), and cerebral infarction without residual deficits: Secondary | ICD-10-CM | POA: Insufficient documentation

## 2024-02-04 DIAGNOSIS — R9431 Abnormal electrocardiogram [ECG] [EKG]: Secondary | ICD-10-CM

## 2024-02-04 DIAGNOSIS — W19XXXA Unspecified fall, initial encounter: Secondary | ICD-10-CM | POA: Insufficient documentation

## 2024-02-04 DIAGNOSIS — N179 Acute kidney failure, unspecified: Secondary | ICD-10-CM | POA: Diagnosis present

## 2024-02-04 DIAGNOSIS — R55 Syncope and collapse: Secondary | ICD-10-CM | POA: Diagnosis not present

## 2024-02-04 DIAGNOSIS — Z85828 Personal history of other malignant neoplasm of skin: Secondary | ICD-10-CM | POA: Insufficient documentation

## 2024-02-04 DIAGNOSIS — G9341 Metabolic encephalopathy: Secondary | ICD-10-CM | POA: Insufficient documentation

## 2024-02-04 DIAGNOSIS — Z87891 Personal history of nicotine dependence: Secondary | ICD-10-CM | POA: Insufficient documentation

## 2024-02-04 DIAGNOSIS — R531 Weakness: Secondary | ICD-10-CM

## 2024-02-04 DIAGNOSIS — K219 Gastro-esophageal reflux disease without esophagitis: Secondary | ICD-10-CM | POA: Diagnosis present

## 2024-02-04 DIAGNOSIS — R296 Repeated falls: Secondary | ICD-10-CM | POA: Insufficient documentation

## 2024-02-04 DIAGNOSIS — E785 Hyperlipidemia, unspecified: Secondary | ICD-10-CM | POA: Diagnosis present

## 2024-02-04 DIAGNOSIS — Z79899 Other long term (current) drug therapy: Secondary | ICD-10-CM | POA: Insufficient documentation

## 2024-02-04 DIAGNOSIS — Y92013 Bedroom of single-family (private) house as the place of occurrence of the external cause: Secondary | ICD-10-CM | POA: Insufficient documentation

## 2024-02-04 DIAGNOSIS — F319 Bipolar disorder, unspecified: Secondary | ICD-10-CM | POA: Diagnosis present

## 2024-02-04 DIAGNOSIS — Z7902 Long term (current) use of antithrombotics/antiplatelets: Secondary | ICD-10-CM | POA: Insufficient documentation

## 2024-02-04 DIAGNOSIS — R2689 Other abnormalities of gait and mobility: Secondary | ICD-10-CM | POA: Insufficient documentation

## 2024-02-04 DIAGNOSIS — I639 Cerebral infarction, unspecified: Secondary | ICD-10-CM | POA: Diagnosis present

## 2024-02-04 DIAGNOSIS — E876 Hypokalemia: Principal | ICD-10-CM

## 2024-02-04 DIAGNOSIS — I10 Essential (primary) hypertension: Secondary | ICD-10-CM | POA: Diagnosis present

## 2024-02-04 LAB — GLUCOSE, CAPILLARY: Glucose-Capillary: 108 mg/dL — ABNORMAL HIGH (ref 70–99)

## 2024-02-04 LAB — BASIC METABOLIC PANEL WITH GFR
Anion gap: 12 (ref 5–15)
Anion gap: 11 (ref 5–15)
BUN: 24 mg/dL — ABNORMAL HIGH (ref 8–23)
BUN: 18 mg/dL (ref 8–23)
CO2: 26 mmol/L (ref 22–32)
CO2: 24 mmol/L (ref 22–32)
Calcium: 8.9 mg/dL (ref 8.9–10.3)
Calcium: 8.6 mg/dL — ABNORMAL LOW (ref 8.9–10.3)
Chloride: 98 mmol/L (ref 98–111)
Chloride: 104 mmol/L (ref 98–111)
Creatinine, Ser: 1.05 mg/dL — ABNORMAL HIGH (ref 0.44–1.00)
Creatinine, Ser: 0.94 mg/dL (ref 0.44–1.00)
GFR, Estimated: 53 mL/min — ABNORMAL LOW
GFR, Estimated: >60 mL/min
Glucose, Bld: 104 mg/dL — ABNORMAL HIGH (ref 70–99)
Glucose, Bld: 117 mg/dL — ABNORMAL HIGH (ref 70–99)
Potassium: 2.7 mmol/L — CL (ref 3.5–5.1)
Potassium: 3.5 mmol/L (ref 3.5–5.1)
Sodium: 137 mmol/L (ref 135–145)
Sodium: 139 mmol/L (ref 135–145)

## 2024-02-04 LAB — CBC WITH DIFFERENTIAL/PLATELET
Abs Immature Granulocytes: 0.06 10*3/uL (ref 0.00–0.07)
Basophils Absolute: 0.1 10*3/uL (ref 0.0–0.1)
Basophils Relative: 1 %
Eosinophils Absolute: 0.2 10*3/uL (ref 0.0–0.5)
Eosinophils Relative: 2 %
HCT: 28.5 % — ABNORMAL LOW (ref 36.0–46.0)
Hemoglobin: 9.7 g/dL — ABNORMAL LOW (ref 12.0–15.0)
Immature Granulocytes: 1 %
Lymphocytes Relative: 40 %
Lymphs Abs: 3.5 10*3/uL (ref 0.7–4.0)
MCH: 28.4 pg (ref 26.0–34.0)
MCHC: 34 g/dL (ref 30.0–36.0)
MCV: 83.3 fL (ref 80.0–100.0)
Monocytes Absolute: 0.4 10*3/uL (ref 0.1–1.0)
Monocytes Relative: 5 %
Neutro Abs: 4.6 10*3/uL (ref 1.7–7.7)
Neutrophils Relative %: 51 %
Platelets: 163 10*3/uL (ref 150–400)
RBC: 3.42 MIL/uL — ABNORMAL LOW (ref 3.87–5.11)
RDW: 13.6 % (ref 11.5–15.5)
WBC: 8.8 10*3/uL (ref 4.0–10.5)
nRBC: 0 % (ref 0.0–0.2)

## 2024-02-04 LAB — URINALYSIS, ROUTINE W REFLEX MICROSCOPIC
Bilirubin Urine: NEGATIVE
Glucose, UA: NEGATIVE mg/dL
Hgb urine dipstick: NEGATIVE
Ketones, ur: NEGATIVE mg/dL
Nitrite: NEGATIVE
Protein, ur: NEGATIVE mg/dL
Specific Gravity, Urine: 1.009 (ref 1.005–1.030)
WBC, UA: >50 WBC/hpf (ref 0–5)
pH: 6 (ref 5.0–8.0)

## 2024-02-04 LAB — URINALYSIS, W/ REFLEX TO CULTURE (INFECTION SUSPECTED)
Bilirubin Urine: NEGATIVE
Glucose, UA: NEGATIVE mg/dL
Hgb urine dipstick: NEGATIVE
Ketones, ur: NEGATIVE mg/dL
Nitrite: NEGATIVE
Protein, ur: NEGATIVE mg/dL
Specific Gravity, Urine: 1.01 (ref 1.005–1.030)
WBC, UA: >50 WBC/hpf (ref 0–5)
pH: 6 (ref 5.0–8.0)

## 2024-02-04 LAB — CBC
HCT: 29.9 % — ABNORMAL LOW (ref 36.0–46.0)
Hemoglobin: 10.1 g/dL — ABNORMAL LOW (ref 12.0–15.0)
MCH: 28.5 pg (ref 26.0–34.0)
MCHC: 33.8 g/dL (ref 30.0–36.0)
MCV: 84.2 fL (ref 80.0–100.0)
Platelets: 167 10*3/uL (ref 150–400)
RBC: 3.55 MIL/uL — ABNORMAL LOW (ref 3.87–5.11)
RDW: 13.6 % (ref 11.5–15.5)
WBC: 10 10*3/uL (ref 4.0–10.5)
nRBC: 0 % (ref 0.0–0.2)

## 2024-02-04 LAB — CBG MONITORING, ED
Glucose-Capillary: 144 mg/dL — ABNORMAL HIGH (ref 70–99)
Glucose-Capillary: 109 mg/dL — ABNORMAL HIGH (ref 70–99)

## 2024-02-04 LAB — CREATININE, SERUM
Creatinine, Ser: 0.98 mg/dL (ref 0.44–1.00)
GFR, Estimated: 58 mL/min — ABNORMAL LOW

## 2024-02-04 LAB — PROTIME-INR
INR: 1 (ref 0.8–1.2)
Prothrombin Time: 13.7 s (ref 11.4–15.2)

## 2024-02-04 LAB — MAGNESIUM: Magnesium: 1.9 mg/dL (ref 1.7–2.4)

## 2024-02-04 MED ORDER — POTASSIUM CHLORIDE 10 MEQ/100ML IV SOLN
10.0000 meq | INTRAVENOUS | Status: AC
  Administered 2024-02-04 (×3): 10 meq via INTRAVENOUS
  Filled 2024-02-04 (×3): qty 100

## 2024-02-04 MED ORDER — TRAZODONE HCL 50 MG PO TABS
25.0000 mg | ORAL_TABLET | Freq: Every day | ORAL | Status: DC
  Administered 2024-02-04 – 2024-02-05 (×2): 25 mg via ORAL
  Filled 2024-02-04 (×2): qty 1

## 2024-02-04 MED ORDER — ONDANSETRON HCL 4 MG PO TABS: 4.0000 mg | ORAL_TABLET | Freq: Four times a day (QID) | ORAL | Status: DC | PRN

## 2024-02-04 MED ORDER — PNEUMOCOCCAL 20-VAL CONJ VACC 0.5 ML IM SUSY
0.5000 mL | PREFILLED_SYRINGE | INTRAMUSCULAR | Status: DC
  Filled 2024-02-04: qty 0.5

## 2024-02-04 MED ORDER — CLOPIDOGREL BISULFATE 75 MG PO TABS
75.0000 mg | ORAL_TABLET | Freq: Every day | ORAL | Status: DC
  Administered 2024-02-04 – 2024-02-06 (×3): 75 mg via ORAL
  Filled 2024-02-04 (×3): qty 1

## 2024-02-04 MED ORDER — ENSURE PLUS HIGH PROTEIN PO LIQD
237.0000 mL | Freq: Two times a day (BID) | ORAL | Status: DC
  Administered 2024-02-05 – 2024-02-06 (×3): 237 mL via ORAL

## 2024-02-04 MED ORDER — INSULIN ASPART 100 UNIT/ML IJ SOLN: 0.0000 [IU] | Freq: Every day | INTRAMUSCULAR | Status: DC

## 2024-02-04 MED ORDER — INSULIN GLARGINE-YFGN 100 UNIT/ML ~~LOC~~ SOLN
7.0000 [IU] | Freq: Every day | SUBCUTANEOUS | Status: DC
  Administered 2024-02-04 – 2024-02-05 (×2): 7 [IU] via SUBCUTANEOUS
  Filled 2024-02-04 (×3): qty 0.07

## 2024-02-04 MED ORDER — SODIUM CHLORIDE 0.9 % IV BOLUS
1000.0000 mL | Freq: Once | INTRAVENOUS | Status: AC
  Administered 2024-02-04: 1000 mL via INTRAVENOUS

## 2024-02-04 MED ORDER — SODIUM CHLORIDE 0.9 % IV BOLUS
500.0000 mL | Freq: Once | INTRAVENOUS | Status: AC
  Administered 2024-02-04: 500 mL via INTRAVENOUS

## 2024-02-04 MED ORDER — ONDANSETRON HCL 4 MG/2ML IJ SOLN: 4.0000 mg | Freq: Four times a day (QID) | INTRAMUSCULAR | Status: DC | PRN

## 2024-02-04 MED ORDER — METOPROLOL SUCCINATE ER 25 MG PO TB24
25.0000 mg | ORAL_TABLET | Freq: Every day | ORAL | Status: DC
  Administered 2024-02-04 – 2024-02-06 (×3): 25 mg via ORAL
  Filled 2024-02-04 (×3): qty 1

## 2024-02-04 MED ORDER — VENLAFAXINE HCL ER 75 MG PO CP24
225.0000 mg | ORAL_CAPSULE | Freq: Every day | ORAL | Status: DC
  Administered 2024-02-04 – 2024-02-06 (×3): 225 mg via ORAL
  Filled 2024-02-04 (×3): qty 1

## 2024-02-04 MED ORDER — GABAPENTIN 300 MG PO CAPS
300.0000 mg | ORAL_CAPSULE | Freq: Every day | ORAL | Status: DC
  Administered 2024-02-04 – 2024-02-05 (×2): 300 mg via ORAL
  Filled 2024-02-04 (×2): qty 1

## 2024-02-04 MED ORDER — INSULIN ASPART 100 UNIT/ML IJ SOLN
0.0000 [IU] | Freq: Three times a day (TID) | INTRAMUSCULAR | Status: DC
  Administered 2024-02-04: 1 [IU] via SUBCUTANEOUS
  Administered 2024-02-05: 2 [IU] via SUBCUTANEOUS
  Administered 2024-02-06: 1 [IU] via SUBCUTANEOUS
  Filled 2024-02-04: qty 2
  Filled 2024-02-04 (×2): qty 1

## 2024-02-04 MED ORDER — FENTANYL CITRATE (PF) 50 MCG/ML IJ SOSY: 12.5000 ug | PREFILLED_SYRINGE | INTRAMUSCULAR | Status: DC | PRN

## 2024-02-04 MED ORDER — SODIUM CHLORIDE 0.9 % IV SOLN
1.0000 g | INTRAVENOUS | Status: DC
  Administered 2024-02-04 – 2024-02-06 (×3): 1 g via INTRAVENOUS
  Filled 2024-02-04 (×4): qty 10

## 2024-02-04 MED ORDER — POTASSIUM CHLORIDE 10 MEQ/100ML IV SOLN
10.0000 meq | INTRAVENOUS | Status: AC
  Administered 2024-02-04 (×4): 10 meq via INTRAVENOUS
  Filled 2024-02-04 (×4): qty 100

## 2024-02-04 MED ORDER — ATORVASTATIN CALCIUM 20 MG PO TABS
40.0000 mg | ORAL_TABLET | Freq: Every day | ORAL | Status: DC
  Administered 2024-02-04 – 2024-02-06 (×3): 40 mg via ORAL
  Filled 2024-02-04 (×3): qty 2

## 2024-02-04 MED ORDER — ACETAMINOPHEN 650 MG RE SUPP: 650.0000 mg | Freq: Four times a day (QID) | RECTAL | Status: DC | PRN

## 2024-02-04 MED ORDER — POLYETHYLENE GLYCOL 3350 17 G PO PACK
17.0000 g | PACK | Freq: Every day | ORAL | Status: DC | PRN
  Administered 2024-02-06: 17 g via ORAL
  Filled 2024-02-04: qty 1

## 2024-02-04 MED ORDER — ACETAMINOPHEN 325 MG PO TABS: 650.0000 mg | ORAL_TABLET | Freq: Four times a day (QID) | ORAL | Status: DC | PRN

## 2024-02-04 MED ORDER — ENOXAPARIN SODIUM 40 MG/0.4ML IJ SOSY
40.0000 mg | PREFILLED_SYRINGE | INTRAMUSCULAR | Status: DC
  Administered 2024-02-05: 40 mg via SUBCUTANEOUS
  Filled 2024-02-04 (×2): qty 0.4

## 2024-02-04 MED ORDER — OXYCODONE HCL 5 MG PO TABS: 5.0000 mg | ORAL_TABLET | ORAL | Status: DC | PRN

## 2024-02-04 MED ORDER — QUETIAPINE FUMARATE 25 MG PO TABS
75.0000 mg | ORAL_TABLET | Freq: Every day | ORAL | Status: DC
  Administered 2024-02-04 – 2024-02-05 (×2): 75 mg via ORAL
  Filled 2024-02-04 (×2): qty 3

## 2024-02-04 NOTE — ED Notes (Signed)
 Pt ringing call bell for assistance going to the bathroom. Pt able to use bedside commode with x1 assist. Pt unable to ambulate small distance to toilet due to dizziness. Urine sample and blood work collected. Pt currently sitting up in bed eating breakfast tray provided. Primary Nurse made aware.

## 2024-02-04 NOTE — ED Notes (Signed)
Patient to CT via stretcher with CT tech.

## 2024-02-04 NOTE — ED Notes (Signed)
Pt in bed, pt denies pain, pt has no requests at this time.  

## 2024-02-04 NOTE — ED Notes (Signed)
 MD at bedside.

## 2024-02-04 NOTE — Evaluation (Signed)
 " Occupational Therapy Evaluation Patient Details Name: Haley Mooney MRN: [id number] DOB: 08/27/1940 Today's Date: 02/04/2024   History of Present Illness   Lucero Auzenne is a pleasant 84 y.o. female with medical history significant for stroke, DM, HTN, HLD, bipolar disorder, anxiety, mild dementia who was brought in for a fall at [facility] living facility.     Clinical Impressions Pt was seen for OT evaluation this date. Prior to hospital admission, pt was Independent/Mod I. Pt lives at [facility]. Pt presents to acute OT demonstrating impaired ADL performance and functional mobility 2/2 decreased activity tolerance and functional strength/ROM/balance deficits. Pt currently requires CGA + RW for simulated toilet transfer, ~20 ft from elevated stretcher. Pt reported feeling dizzy, negative orthostatics. Endorses right vision blurriness, not new, limiting tolerance. Pt would benefit from skilled OT services to address noted impairments and functional limitations (see below for any additional details) in order to maximize safety and independence while minimizing future risk of falls, injury, and readmission. Anticipate the need for follow up OT services upon acute hospital DC.    Orthostatic VS for the past 24 hrs:  BP- Lying Pulse- Lying BP- Sitting Pulse- Sitting BP- Standing at 0 minutes Pulse- Standing at 0 minutes  02/04/24 1310 101/64 84 94/66 85 108/63 91         If plan is discharge home, recommend the following:   A little help with walking and/or transfers;A little help with bathing/dressing/bathroom;Assistance with cooking/housework;Help with stairs or ramp for entrance;Assist for transportation     Functional Status Assessment   Patient has had a recent decline in their functional status and demonstrates the ability to make significant improvements in function in a reasonable and predictable amount of time.     Equipment Recommendations    BSC/3in1     Recommendations for Other Services         Precautions/Restrictions   Precautions Precautions: Fall Recall of Precautions/Restrictions: Intact Restrictions Weight Bearing Restrictions Per Provider Order: No     Mobility Bed Mobility Overal bed mobility: Needs Assistance Bed Mobility: Supine to Sit, Sit to Supine     Supine to sit: Contact guard Sit to supine: Contact guard assist        Transfers Overall transfer level: Needs assistance Equipment used: Rolling walker (2 wheels) Transfers: Sit to/from Stand Sit to Stand: Contact guard assist                  Balance Overall balance assessment: Needs assistance, History of Falls Sitting-balance support: Feet supported Sitting balance-Leahy Scale: Good     Standing balance support: Bilateral upper extremity supported, During functional activity, Reliant on assistive device for balance Standing balance-Leahy Scale: Fair                             ADL either performed or assessed with clinical judgement   ADL Overall ADL's : Needs assistance/impaired                                       General ADL Comments: Pt CGA + RW for simulated toilet transfer.     Vision         Perception         Praxis         Pertinent Vitals/Pain Pain Assessment Pain Assessment: Faces Faces Pain Scale:  Hurts little more Pain Location: R hip Pain Descriptors / Indicators: Discomfort, Dull, Grimacing Pain Intervention(s): Monitored during session, Limited activity within patient's tolerance     Extremity/Trunk Assessment Upper Extremity Assessment Upper Extremity Assessment: Generalized weakness   Lower Extremity Assessment Lower Extremity Assessment: Generalized weakness       Communication Communication Communication: No apparent difficulties   Cognition Arousal: Alert Behavior During Therapy: WFL for tasks assessed/performed Cognition: Cognition  impaired       Memory impairment (select all impairments): Short-term memory                       Following commands: Intact       Cueing  General Comments   Cueing Techniques: Verbal cues;Tactile cues      Exercises     Shoulder Instructions      Home Living Family/patient expects to be discharged to:: Assisted living                             Home Equipment: Rolling Walker (2 wheels)          Prior Functioning/Environment Prior Level of Function : Independent/Modified Independent             Mobility Comments: walks short distance to dining hall ADLs Comments: assist for meals    OT Problem List: Decreased strength;Decreased range of motion;Decreased activity tolerance;Impaired balance (sitting and/or standing)   OT Treatment/Interventions: Self-care/ADL training;Therapeutic exercise;DME and/or AE instruction;Patient/family education;Balance training      OT Goals(Current goals can be found in the care plan section)   Acute Rehab OT Goals Patient Stated Goal: go home OT Goal Formulation: With patient Time For Goal Achievement: 02/18/24 Potential to Achieve Goals: Good ADL Goals Pt Will Perform Upper Body Bathing: standing;with modified independence Pt Will Perform Lower Body Dressing: with modified independence;sit to/from stand Pt Will Transfer to Toilet: with modified independence;ambulating;regular height toilet   OT Frequency:  Min 2X/week    Co-evaluation              AM-PAC OT 6 Clicks Daily Activity     Outcome Measure Help from another person eating meals?: None Help from another person taking care of personal grooming?: A Little Help from another person toileting, which includes using toliet, bedpan, or urinal?: A Little Help from another person bathing (including washing, rinsing, drying)?: A Little Help from another person to put on and taking off regular upper body clothing?: A Little Help from  another person to put on and taking off regular lower body clothing?: A Little 6 Click Score: 19   End of Session Equipment Utilized During Treatment: Gait belt;Rolling walker (2 wheels)  Activity Tolerance: Patient tolerated treatment well Patient left: in bed;with call bell/phone within reach;with bed alarm set  OT Visit Diagnosis: Unsteadiness on feet (R26.81);Other abnormalities of gait and mobility (R26.89);Repeated falls (R29.6);History of falling (Z91.81);Muscle weakness (generalized) (M62.81)                Time: 9892-9866 OT Time Calculation (min): 26 min Charges:  OT General Charges $OT Visit: 1 Visit OT Evaluation $OT Eval Low Complexity: 1 Low  [place] Yepiz OTS   Ronita Sauers 02/04/2024, 2:36 PM "

## 2024-02-04 NOTE — ED Provider Notes (Signed)
 "  Haley Mooney    Event Date/Time   First MD Initiated Contact with Patient 02/04/24 [phone]     (approximate)   History   Fall and Hypotension   HPI  Haley Mooney is a 84 y.o. female   with some dementia notably well-oriented today  Also has a history of prior stroke kidney injury hyperlipidemia  Patient reports for 5 or 6 days she has been feeling weak in both of her legs just very tired like her muscles are tired.  She got up from bed today and when she did she immediately felt lightheaded and almost passed out but reports she did not fully faint.  She fell to the floor.  She did strike her head hard but reports no neck pain no headache.  Denies any other injury.  She still eating and drinking she has not had a fever she has not felt particularly ill but she is low on energy and tired for the last several days  No chest pain no shortness of breath  Reviewed external records from prior admission where she had blood cxs growing klebsiella   Past Medical History:  Diagnosis Date   Anxiety    Arthritis    Bipolar disorder (HCC)    CAD (coronary artery disease)    Cancer (HCC)    SKIN   Depression    Diabetes mellitus without complication (HCC)    GERD (gastroesophageal reflux disease)    H/O   Heart murmur    Hypertension    Neuromuscular disorder (HCC)    NEUROPATHY   Pneumonia    IN PAST   Shortness of breath dyspnea    Sleep apnea    CPAP   Stroke Haley Mooney)    Wears dentures    full upper, partial lower     Physical Exam   Triage Vital Signs: ED Triage Vitals [02/04/24 0128]  Encounter Vitals Group     BP (!) 106/57     Girls Systolic BP Percentile      Girls Diastolic BP Percentile      Boys Systolic BP Percentile      Boys Diastolic BP Percentile      Pulse Rate 70     Resp 10     Temp 97.6 F (36.4 C)     Temp Source Oral     SpO2 100 %     Weight 166 lb 10.7 oz (75.6 kg)     Height 5' 6 (1.676 m)     Head  Circumference      Peak Flow      Pain Score 3     Pain Loc      Pain Education      Exclude from Growth Chart     Most recent vital signs: Vitals:   02/04/24 0330 02/04/24 0400  BP: 121/83 112/74  Pulse: 79 77  Resp: 13 16  Temp:    SpO2: 100% 100%     General: Awake, no distress.  Pleasant normocephalic atraumatic no cervical tenderness she is fully oriented appropriately identifying the year is 2026 her living facility is [facility]. CV:   Good peripheral perfusion. Normal rate and heart tones. Resp:   Normal effort. Lung sounds clear bilateral. Speaking without distress. Abd:   No distention. Soft, non-tender to palpation in all quadrants. No rebound or guarding. Neuro:   No focal neuro deficits noted. Moves extremities well without noted concern. Other:  She is generally  fatigued and in no acute distress moves all extremities no obvious deficits.  Skin feels warm.  She is alert and oriented.   ED Results / Procedures / Treatments   Labs (all labs ordered are listed, but only abnormal results are displayed) Labs Reviewed  CBC WITH DIFFERENTIAL/PLATELET - Abnormal; Notable for the following components:      Result Value   RBC 3.42 (*)    Hemoglobin 9.7 (*)    HCT 28.5 (*)    All other components within normal limits  BASIC METABOLIC PANEL WITH GFR - Abnormal; Notable for the following components:   Potassium 2.7 (*)    Glucose, Bld 104 (*)    BUN 24 (*)    Creatinine, Ser 1.05 (*)    GFR, Estimated 53 (*)    All other components within normal limits  PROTIME-INR  MAGNESIUM  URINALYSIS, ROUTINE W REFLEX MICROSCOPIC     EKG  I independently reviewed the EKG and interpreted by me at 130 heart rate 70 QRS 100 QTc 440.  Sinus rhythm.  T wave inversions and slight slurring of the T waves in multiple leads.  Does not appear to clearly represent an ischemic pattern but we will check a single troponin, favor possible underlying electrolyte abnormality.  Some similarity  to the EKG from November 3 of last year   RADIOLOGY     CT Cervical Spine Wo Contrast Result Date: 02/04/2024 EXAM: CT CERVICAL SPINE WITHOUT CONTRAST 02/04/2024 02:48:24 AM TECHNIQUE: CT of the cervical spine was performed without the administration of intravenous contrast. Multiplanar reformatted images are provided for review. Automated exposure control, iterative reconstruction, and/or weight based adjustment of the mA/kV was utilized to reduce the radiation dose to as low as reasonably achievable. COMPARISON: None available. CLINICAL HISTORY: Facial trauma, blunt. FINDINGS: BONES AND ALIGNMENT: No acute fracture or traumatic malalignment. C3-C5 anterior cervical discectomy and fusion with instrumentation with solid incorporation of the interbody bone graft and ankylosis of the facet joints bilaterally. 3 mm anterolisthesis C7-T1, likely degenerative in nature. DEGENERATIVE CHANGES: Disc space narrowing and endplate remodeling is seen throughout the remainder of the visualized cervicothoracic spine in keeping with changes of advanced degenerative disc disease. Hypertrophic changes result in moderate-to-severe neuroforaminal narrowing on the right at C2-C3, bilaterally at C3-C4 and C4-C5 and on the left at C5-C6. No high-grade canal stenosis. SOFT TISSUES: No prevertebral soft tissue swelling. IMPRESSION: 1. No acute cervical spine findings. 2. C3-5 anterior cervical discectomy and fusion with solid incorporation of the interbody bone graft and ankylosis of the facet joints bilaterally. 3. Advanced degenerative disc disease with moderate-to-severe neuroforaminal narrowing at multiple levels, without high-grade canal stenosis. 4. 3 mm anterolisthesis at C7-T1, likely degenerative in nature. Electronically signed by: Dorethia Molt MD 02/04/2024 03:13 AM EST RP Workstation: [id number]   DG Chest Portable 1 View Result Date: 02/04/2024 EXAM: 1 VIEW(S) XRAY OF THE CHEST 02/04/2024 02:56:00 AM COMPARISON:  11/11/2023 CLINICAL HISTORY: Weakness, evaluate for occult infection. FINDINGS: LUNGS AND PLEURA: No focal pulmonary opacity. No pleural effusion. No pneumothorax. HEART AND MEDIASTINUM: No acute abnormality of the cardiac and mediastinal silhouettes. BONES AND SOFT TISSUES: Cervical fusion hardware noted. No displaced rib fractures. Thoracic spondylosis. IMPRESSION: 1. No acute cardiopulmonary findings. 2. Cervical fusion hardware. 3. Thoracic spondylosis. Electronically signed by: Dorethia Molt MD 02/04/2024 03:10 AM EST RP Workstation: [id number]   CT Head Wo Contrast Result Date: 02/04/2024 EXAM: CT HEAD WITHOUT CONTRAST 02/04/2024 02:48:24 AM TECHNIQUE: CT of the head was performed without the administration  of intravenous contrast. Automated exposure control, iterative reconstruction, and/or weight based adjustment of the mA/kV was utilized to reduce the radiation dose to as low as reasonably achievable. COMPARISON: 12/30/2023 CLINICAL HISTORY: Facial trauma, blunt. FINDINGS: BRAIN AND VENTRICLES: No acute hemorrhage. No evidence of acute infarct. Patchy and confluent decreased attenuation throughout the deep and periventricular white matter of the cerebral hemispheres bilaterally, compatible with chronic microvascular ischemic disease. Cerebral ventricle sizes concordant with the degree of cerebral volume loss. No hydrocephalus. No extra-axial collection. No mass effect or midline shift. Atherosclerotic calcifications within the cavernous internal carotid and vertebral arteries. ORBITS: Bilateral lens replacement. SINUSES: Right maxillary sinus mucosal thickening. SOFT TISSUES AND SKULL: No acute soft tissue abnormality. No skull fracture. IMPRESSION: 1. No acute intracranial abnormality related to facial trauma. 2. 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 3. Cerebral volume loss with ventricle sizes concordant with the degree of volume loss. Electronically  signed by: Dorethia Molt MD 02/04/2024 03:10 AM EST RP Workstation: [id number]       PROCEDURES:  Critical Care performed: Yes, see critical care procedure Mooney(s)  CRITICAL CARE Performed by: Oneil Budge   Total critical care time: 30 minutes  Critical care time was exclusive of separately billable procedures and treating other patients.  Critical care was necessary to treat or prevent imminent or life-threatening deterioration.  Critical care was time spent personally by me on the following activities: development of treatment plan with patient and/or surrogate as well as nursing, discussions with consultants, evaluation of patient's response to treatment, examination of patient, obtaining history from patient or surrogate, ordering and performing treatments and interventions, ordering and review of laboratory studies, ordering and review of radiographic studies, pulse oximetry and re-evaluation of patient's condition.   Patient with marked hypokalemia with abnormal ECG and a near syncopal episode.  Also generalized fatigue and weakness.  Requiring multiple runs of IV potassium for repletion cardiac monitoring  Procedures   MEDICATIONS ORDERED IN ED: Medications  potassium chloride  10 mEq in 100 mL IVPB (10 mEq Intravenous New Bag/Given 02/04/24 0457)  sodium chloride  0.9 % bolus 1,000 mL (1,000 mLs Intravenous New Bag/Given 02/04/24 0254)     IMPRESSION / MDM / ASSESSMENT AND PLAN / ED COURSE  I reviewed the triage vital signs and the nursing notes.                              Based on presentation, the differential diagnosis includes, but is not limited to key considerations: syncope DIFFERENTIAL DIAGNOSIS CONSIDERATIONS: High-acuity etiologies considered and prioritized for rule-out: - Cardiac Arrhythmia (VT / VF / SVT / AV Block) - Structural Heart Disease (HOCM / Critical AS) - Pulmonary Embolism - Acute Coronary Syndrome - Aortic Dissection - Subarachnoid  Hemorrhage  Obtain CT of the head.  Reassuring neurologic exam no headache but given her age use of Plavix  obtain imaging.  Concern for possible cardiac dysrhythmia effect of hypokalemia, etc. strongly considered no chest pain no shortness of breath no hypoxia no tachycardia making PE extremely unlikely.  No chest pain.  Unlikely to represent ACS or aortic dissection.  Also has a history of previous bacteremia but no fever today white count normal no infectious symptoms will check urinalysis chest x-ray though for screening     Patient's presentation is most consistent with acute presentation with potential threat to life or bodily function.    The patient is on the cardiac monitor  to evaluate for evidence of arrhythmia and/or significant heart rate changes.    ----------------------------------------- 5:45 AM on 02/04/2024 ----------------------------------------- Patient accepted to hospital service under care of Dr. Lawence  Urinalysis pending at time of admission decision  FINAL CLINICAL IMPRESSION(S) / ED DIAGNOSES   Final diagnoses:  Hypokalemia  Near syncope  Abnormal EKG  Generalized weakness     Rx / DC Orders   ED Discharge Orders     None        Mooney:  This document was prepared using Dragon voice recognition software and may include unintentional dictation errors.   Dicky Anes, MD 02/04/24 [phone]  "

## 2024-02-04 NOTE — H&P (Signed)
 "  History and Physical    Haley Mooney FMW:[id number] DOB: 11/09/1940 DOA: 02/04/2024  DOS: the patient was seen and examined on 02/04/2024  PCP: Agustina Greig NOVAK, MD   Patient coming from: [facility]   I have personally briefly reviewed patient's old medical records in [facility]  Chief Complaint: Fall 30 minutes prior to arrival to ED  HPI: Haley Mooney is a pleasant 84 y.o. female with medical history significant for stroke, DM, HTN, HLD, bipolar disorder, anxiety, mild dementia who was brought in for a fall at the [facility] living facility.  Patient stated that she was feeling weak on both legs, very tired and muscles giveaway.  She did not loss consciousness but she did hit her head.  She denies any headache, neck pain or external injuries.  She denies any fever, chills, nausea, vomiting, chest pain, cough, shortness of breath, palpitations, dysuria, hematuria.  ED Course: Upon arrival to the ED, patient is found to have reassuring workup except severe hypokalemia with potassium at 2.7, mild dehydration with creatinine of 1.05 and BUN 24, EKG with sinus sinus rhythm, CT head and CT cervical spine were unremarkable for fractures or stroke.  Urine analysis was positive for urinary tract infection.  Hospitalist service was consulted for evaluation for admission for a fall, UTI, muscle weakness and severe hypokalemia.  Review of Systems:  ROS  All other systems negative except as noted in the HPI.  Past Medical History:  Diagnosis Date   Anxiety    Arthritis    Bipolar disorder (HCC)    CAD (coronary artery disease)    Cancer (HCC)    SKIN   Depression    Diabetes mellitus without complication (HCC)    GERD (gastroesophageal reflux disease)    H/O   Heart murmur    Hypertension    Neuromuscular disorder (HCC)    NEUROPATHY   Pneumonia    IN PAST   Shortness of breath dyspnea    Sleep apnea    CPAP   Stroke (HCC)    Wears dentures    full upper, partial lower     Past Surgical History:  Procedure Laterality Date   ABDOMINAL HYSTERECTOMY     APPENDECTOMY     BACK SURGERY     BLADDER SUSPENSION     BROW LIFT Bilateral 12/16/2019   Procedure: BLEPHAROPLASTY UPPER EYELID; W/EXCESS SKIN BROW PTOSIS REPAIR BILATERAL DIABETIC;  Surgeon: Ashley Greig HERO, MD;  Location: [facility];  Service: Ophthalmology;  Laterality: Bilateral;  Diabetic - insulin  and oral meds   CATARACT EXTRACTION Left    CATARACT EXTRACTION W/PHACO Left 08/08/2015   Procedure: CATARACT EXTRACTION PHACO AND INTRAOCULAR LENS PLACEMENT (IOC);  Surgeon: Elsie Carmine, MD;  Location: [facility];  Service: Ophthalmology;  Laterality: Left;  US  00:55 AP% 17.1 CDE 9.48 fluid pack lot # [id number]   CATARACT EXTRACTION W/PHACO Right 09/05/2015   Procedure: CATARACT EXTRACTION PHACO AND INTRAOCULAR LENS PLACEMENT (IOC);  Surgeon: Elsie Carmine, MD;  Location: [facility];  Service: Ophthalmology;  Laterality: Right;  Lot# [id number] US :00:43.0 AP%: 20.3 CDE: 8.74   COLONOSCOPY     CORONARY ANGIOPLASTY WITH STENT PLACEMENT     CATH DONE [facility] 6/17 DR PAULA MILLER   EYE SURGERY     JOINT REPLACEMENT     TONSILLECTOMY       reports that she quit smoking about 15 years ago. Her smoking use included e-cigarettes. She uses smokeless tobacco. She reports current  alcohol  use. She reports that she does not use drugs.  Allergies[1]  Family History  Problem Relation Age of Onset   Heart failure Father     Prior to Admission medications  Medication Sig Start Date End Date Taking? Authorizing Provider  acetaminophen  (TYLENOL ) 650 MG CR tablet Take 1,300 mg by mouth 3 (three) times daily as needed for pain.   Yes [provider]  atorvastatin  (LIPITOR) 40 MG tablet Take 40 mg by mouth daily.   Yes [provider]  clopidogrel  (PLAVIX ) 75 MG tablet Take 1 tablet (75 mg total) by mouth daily. 08/26/17  Yes Vachhani, Vaibhavkumar, MD  erythromycin  ophthalmic ointment Place 1  Application into both eyes at bedtime.   Yes [provider]  gabapentin  (NEURONTIN ) 300 MG capsule Take 1 capsule by mouth at bedtime. 07/15/17  Yes [provider]  hydrochlorothiazide  (HYDRODIURIL ) 25 MG tablet Take 25 mg by mouth daily.   Yes [provider]  LANTUS  SOLOSTAR 100 UNIT/ML Solostar Pen Inject 7 Units into the skin 2 (two) times daily. *Hold if BS is <100* 12/22/23  Yes [provider]  levalbuterol (XOPENEX HFA) 45 MCG/ACT inhaler Inhale 2 puffs into the lungs every 6 (six) hours as needed for wheezing or shortness of breath.   Yes [provider]  loratadine (CLARITIN) 10 MG tablet Take 10 mg by mouth daily as needed for allergies or itching.   Yes [provider]  metoprolol  succinate (TOPROL -XL) 25 MG 24 hr tablet Take 25 mg by mouth daily. Take with or immediately following a meal.   Yes [provider]  nitroGLYCERIN  (NITROSTAT ) 0.4 MG SL tablet Place 0.4 mg under the tongue every 5 (five) minutes as needed for chest pain.   Yes [provider]  Olopatadine  HCl 0.2 % SOLN 1 drop to affected eye daily. Patient taking differently: Place 1 drop into both eyes daily as needed (itching). 05/26/16  Yes Cook, Jayce G, DO  OZEMPIC, 1 MG/DOSE, 4 MG/3ML SOPN Inject 1 mg into the skin every Wednesday. 12/03/23  Yes [provider]  Polyvinyl Alcohol -Povidone PF (REFRESH) 1.4-0.6 % SOLN Apply 1 drop to eye in the morning, at noon, in the evening, and at bedtime. *Wait 3 to 5 minutes between different eye drops*   Yes [provider]  Pseudoephedrine-DM-GG (ROBITUSSIN CF PO) Take 10 mLs by mouth at bedtime as needed (cough).   Yes [provider]  QUEtiapine  (SEROQUEL ) 25 MG tablet Take 75 mg by mouth at bedtime.   Yes [provider]  traZODone  (DESYREL ) 50 MG tablet Take 25 mg by mouth at bedtime.   Yes [provider]  venlafaxine  XR (EFFEXOR -XR) 75 MG 24 hr capsule Take 225  mg by mouth daily.   Yes [provider]  insulin  detemir (LEVEMIR ) 100 UNIT/ML injection Inject 20 Units into the skin 2 (two) times daily.  Patient not taking: Reported on 02/04/2024    [provider]  nystatin -triamcinolone  (MYCOLOG II) cream Apply to affected area daily Patient not taking: Reported on 02/04/2024 12/23/15   Merilee Andrea CROME, NP    Physical Exam: Vitals:   02/04/24 0330 02/04/24 0400 02/04/24 0530 02/04/24 0934  BP: 121/83 112/74 107/75 93/61  Pulse: 79 77 75 83  Resp: 13 16 18 17   Temp:   97.9 F (36.6 C) (!) 97.5 F (36.4 C)  TempSrc:   Oral Oral  SpO2: 100% 100% 100% 100%  Weight:      Height:  Physical Exam   Constitutional: Alert, awake, calm, comfortable HEENT: Neck supple Respiratory: Clear to auscultation B/L, no wheezing, no rales.  Cardiovascular: Regular rate and rhythm, no murmurs / rubs / gallops. No extremity edema. 2+ pedal pulses. No carotid bruits.  Abdomen: Soft, no tenderness, Bowel sounds positive.  Musculoskeletal: no clubbing / cyanosis. Good ROM, no contractures. Normal muscle tone.  Skin: no rashes, lesions, ulcers. Neurologic: CN 2-12 grossly intact. Sensation intact, No focal deficit identified Psychiatric: Alert and oriented x 3. Normal mood.    Labs on Admission: I have personally reviewed following labs and imaging studies  CBC: Recent Labs  Lab 02/04/24 0136 02/04/24 0811  WBC 8.8 10.0  NEUTROABS 4.6  --   HGB 9.7* 10.1*  HCT 28.5* 29.9*  MCV 83.3 84.2  PLT 163 167   Basic Metabolic Panel: Recent Labs  Lab 02/04/24 0136 02/04/24 0811  NA 137  --   K 2.7*  --   CL 98  --   CO2 26  --   GLUCOSE 104*  --   BUN 24*  --   CREATININE 1.05* 0.98  CALCIUM  8.9  --   MG 1.9  --    GFR: Estimated Creatinine Clearance: 46.8 mL/min (by C-G formula based on SCr of 0.98 mg/dL). Liver Function Tests: No results for input(s): AST, ALT, ALKPHOS, BILITOT, PROT, ALBUMIN in the last 168  hours. No results for input(s): LIPASE, AMYLASE in the last 168 hours. No results for input(s): AMMONIA in the last 168 hours. Coagulation Profile: Recent Labs  Lab 02/04/24 0136  INR 1.0   Cardiac Enzymes: No results for input(s): CKTOTAL, CKMB, CKMBINDEX, TROPONINI, TROPONINIHS in the last 168 hours. BNP (last 3 results) No results for input(s): BNP in the last 8760 hours. HbA1C: No results for input(s): HGBA1C in the last 72 hours. CBG: No results for input(s): GLUCAP in the last 168 hours. Lipid Profile: No results for input(s): CHOL, HDL, LDLCALC, TRIG, CHOLHDL, LDLDIRECT in the last 72 hours. Thyroid Function Tests: No results for input(s): TSH, T4TOTAL, FREET4, T3FREE, THYROIDAB in the last 72 hours. Anemia Panel: No results for input(s): VITAMINB12, FOLATE, FERRITIN, TIBC, IRON, RETICCTPCT in the last 72 hours. Urine analysis:    Component Value Date/Time   COLORURINE YELLOW (A) 02/04/2024 0811   APPEARANCEUR HAZY (A) 02/04/2024 0811   APPEARANCEUR CLOUDY 01/20/2014 1003   LABSPEC 1.009 02/04/2024 0811   LABSPEC 1.020 01/20/2014 1003   PHURINE 6.0 02/04/2024 0811   GLUCOSEU NEGATIVE 02/04/2024 0811   GLUCOSEU 500 mg/dL 98/85/7983 8996   HGBUR NEGATIVE 02/04/2024 0811   BILIRUBINUR NEGATIVE 02/04/2024 0811   BILIRUBINUR NEGATIVE 01/20/2014 1003   KETONESUR NEGATIVE 02/04/2024 0811   PROTEINUR NEGATIVE 02/04/2024 0811   NITRITE NEGATIVE 02/04/2024 0811   LEUKOCYTESUR LARGE (A) 02/04/2024 0811   LEUKOCYTESUR TRACE 01/20/2014 1003    Radiological Exams on Admission: I have personally reviewed images CT Cervical Spine Wo Contrast Result Date: 02/04/2024 EXAM: CT CERVICAL SPINE WITHOUT CONTRAST 02/04/2024 02:48:24 AM TECHNIQUE: CT of the cervical spine was performed without the administration of intravenous contrast. Multiplanar reformatted images are provided for review. Automated exposure control, iterative  reconstruction, and/or weight based adjustment of the mA/kV was utilized to reduce the radiation dose to as low as reasonably achievable. COMPARISON: None available. CLINICAL HISTORY: Facial trauma, blunt. FINDINGS: BONES AND ALIGNMENT: No acute fracture or traumatic malalignment. C3-C5 anterior cervical discectomy and fusion with instrumentation with solid incorporation of the interbody bone graft and ankylosis of the facet  joints bilaterally. 3 mm anterolisthesis C7-T1, likely degenerative in nature. DEGENERATIVE CHANGES: Disc space narrowing and endplate remodeling is seen throughout the remainder of the visualized cervicothoracic spine in keeping with changes of advanced degenerative disc disease. Hypertrophic changes result in moderate-to-severe neuroforaminal narrowing on the right at C2-C3, bilaterally at C3-C4 and C4-C5 and on the left at C5-C6. No high-grade canal stenosis. SOFT TISSUES: No prevertebral soft tissue swelling. IMPRESSION: 1. No acute cervical spine findings. 2. C3-5 anterior cervical discectomy and fusion with solid incorporation of the interbody bone graft and ankylosis of the facet joints bilaterally. 3. Advanced degenerative disc disease with moderate-to-severe neuroforaminal narrowing at multiple levels, without high-grade canal stenosis. 4. 3 mm anterolisthesis at C7-T1, likely degenerative in nature. Electronically signed by: Dorethia Molt MD 02/04/2024 03:13 AM EST RP Workstation: [id number]   DG Chest Portable 1 View Result Date: 02/04/2024 EXAM: 1 VIEW(S) XRAY OF THE CHEST 02/04/2024 02:56:00 AM COMPARISON: 11/11/2023 CLINICAL HISTORY: Weakness, evaluate for occult infection. FINDINGS: LUNGS AND PLEURA: No focal pulmonary opacity. No pleural effusion. No pneumothorax. HEART AND MEDIASTINUM: No acute abnormality of the cardiac and mediastinal silhouettes. BONES AND SOFT TISSUES: Cervical fusion hardware noted. No displaced rib fractures. Thoracic spondylosis. IMPRESSION: 1. No  acute cardiopulmonary findings. 2. Cervical fusion hardware. 3. Thoracic spondylosis. Electronically signed by: Dorethia Molt MD 02/04/2024 03:10 AM EST RP Workstation: [id number]   CT Head Wo Contrast Result Date: 02/04/2024 EXAM: CT HEAD WITHOUT CONTRAST 02/04/2024 02:48:24 AM TECHNIQUE: CT of the head was performed without the administration of intravenous contrast. Automated exposure control, iterative reconstruction, and/or weight based adjustment of the mA/kV was utilized to reduce the radiation dose to as low as reasonably achievable. COMPARISON: 12/30/2023 CLINICAL HISTORY: Facial trauma, blunt. FINDINGS: BRAIN AND VENTRICLES: No acute hemorrhage. No evidence of acute infarct. Patchy and confluent decreased attenuation throughout the deep and periventricular white matter of the cerebral hemispheres bilaterally, compatible with chronic microvascular ischemic disease. Cerebral ventricle sizes concordant with the degree of cerebral volume loss. No hydrocephalus. No extra-axial collection. No mass effect or midline shift. Atherosclerotic calcifications within the cavernous internal carotid and vertebral arteries. ORBITS: Bilateral lens replacement. SINUSES: Right maxillary sinus mucosal thickening. SOFT TISSUES AND SKULL: No acute soft tissue abnormality. No skull fracture. IMPRESSION: 1. No acute intracranial abnormality related to facial trauma. 2. 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 3. Cerebral volume loss with ventricle sizes concordant with the degree of volume loss. Electronically signed by: Dorethia Molt MD 02/04/2024 03:10 AM EST RP Workstation: [id number]    EKG: My personal interpretation of EKG shows: Sinus rhythm, no ST elevation    Assessment/Plan Principal Problem:   Near syncope Active Problems:   CVA (cerebral vascular accident) (HCC)   AKI (acute kidney injury)   Essential hypertension   Hyperlipidemia   GERD (gastroesophageal  reflux disease)   GAD (generalized anxiety disorder)   Bipolar disease, chronic (HCC)   DM (diabetes mellitus) (HCC)    Assessment and Plan: 84 year old female assisted-living facility resident who was brought in for a fall, now has a severe hypokalemia and possible UTI.  1.  Fall at facility facility/presyncope, generalized weakness - Patient will be placed in observation in telemetry - PT/OT - Treatment of underlying cause.  2.  Severe hypokalemia - Will replace potassium and check magnesium level. - Check potassium after replacement.  3.  UTI - Will start her on ceftriaxone  - Will send urine culture  4.  HTN/HLD/history of stroke - Continue Plavix , metoprolol   and atorvastatin  - Continue monitor blood pressure  5.  Dementia/anxiety/depression - Resume her home medication venlafaxine , trazodone , quetiapine  - Supportive care  6.  Type 2 diabetes - Continue insulin  sliding scale and her home dose of long-acting insulin  Lantus  7 units at bedtime - Continue sugar monitoring    DVT prophylaxis: Lovenox  Code Status: DNR/DNI(Do NOT Intubate) Family Communication: None  Disposition Plan: Back to assisted living facility Consults called: None Admission status: Observation, Telemetry bed   Nena Rebel, MD [facility] 02/04/2024, 11:11 AM        [1]  Allergies Allergen Reactions   Ace Inhibitors Cough    Other reaction(s): OTHER   Isosorbide Other (See Comments)    Other reaction(s): Other (See Comments)  cough   Tetracyclines & Related Rash   "

## 2024-02-04 NOTE — ED Triage Notes (Signed)
 Patient brought in by EMS from [facility] for fall with head impact ~30 min ago, no LOC, on plavix . Pt states she felt dizzy prior to fall. Initial BP with EMS 82/52, improved to 111/91 after 250ml NS. AxOx3 in triage (disoriented to time).

## 2024-02-04 NOTE — Evaluation (Signed)
 Physical Therapy Evaluation Patient Details Name: Haley Mooney MRN: [id number] DOB: 12-17-1940 Today's Date: 02/04/2024  History of Present Illness  Haley Mooney is a pleasant 84 y.o. female with medical history significant for stroke, DM, HTN, HLD, bipolar disorder, anxiety, mild dementia who was brought in after a fall at [facility] living facility.  Found to have UTI.  Clinical Impression  Pt pleasantly confused and eager to get up and doing some activity.  She moved relatively well with getting to/from EOB, and was able to do multiple small loops in the room with the walker (~29ft) with some fatigue but no overt LOBs or safety issues.  Pt will benefit from continued PT to address functional limitations.      If plan is discharge home, recommend the following: A little help with walking and/or transfers;A little help with bathing/dressing/bathroom;Assistance with cooking/housework;Assist for transportation;Supervision due to cognitive status   Can travel by private vehicle        Equipment Recommendations None recommended by PT (has walker)  Recommendations for Other Services       Functional Status Assessment Patient has had a recent decline in their functional status and demonstrates the ability to make significant improvements in function in a reasonable and predictable amount of time.     Precautions / Restrictions Precautions Precautions: Fall Recall of Precautions/Restrictions: Intact Restrictions Weight Bearing Restrictions Per Provider Order: No      Mobility  Bed Mobility Overal bed mobility: Needs Assistance Bed Mobility: Supine to Sit, Sit to Supine     Supine to sit: Contact guard Sit to supine: Contact guard assist   General bed mobility comments: Pt needed a little assist to get LEs back up into bed but overall showed good effort from/to supine    Transfers Overall transfer level: Needs assistance Equipment used: Rolling walker (2  wheels) Transfers: Sit to/from Stand Sit to Stand: Contact guard assist           General transfer comment: Pt was able to get to standing from high ED bed, cuing for forward weight shift but no direct physical assist    Ambulation/Gait Ambulation/Gait assistance: Contact guard assist Gait Distance (Feet): 50 Feet Assistive device: Rolling walker (2 wheels)         General Gait Details: Pt with slow but consistent cadence, reliant on walker but not overly so.  Pt reports some fatigue but overall showed good tolerance with modest ambulation effort.  SpO2 dropped but stayed in the 90s with the effort.  Stairs            Wheelchair Mobility     Tilt Bed    Modified Rankin (Stroke Patients Only)       Balance Overall balance assessment: Needs assistance, History of Falls Sitting-balance support: Feet supported Sitting balance-Leahy Scale: Good     Standing balance support: Bilateral upper extremity supported, During functional activity, Reliant on assistive device for balance Standing balance-Leahy Scale: Fair                               Pertinent Vitals/Pain Pain Assessment Faces Pain Scale: Hurts a little bit Pain Location: mild hesitancy with LE movement, but no focal complaints    Home Living Family/patient expects to be discharged to:: Assisted living                 Home Equipment: Rolling Walker (2 wheels) Additional Comments: memory care  Prior Function Prior Level of Function : Independent/Modified Independent             Mobility Comments: walks short distance to dining hall - reports she is up walking a lot ADLs Comments: assist for meals     Extremity/Trunk Assessment   Upper Extremity Assessment Upper Extremity Assessment: Generalized weakness (age appropriate)    Lower Extremity Assessment Lower Extremity Assessment:  (age appropriate defecits)       Communication   Communication Communication: No  apparent difficulties    Cognition Arousal: Alert Behavior During Therapy: WFL for tasks assessed/performed   PT - Cognitive impairments: History of cognitive impairments                       PT - Cognition Comments: alter to self, did knew January but not day or year, knew she was not at [facility] Following commands: Intact       Cueing Cueing Techniques: Verbal cues, Tactile cues     General Comments      Exercises     Assessment/Plan    PT Assessment Patient needs continued PT services  PT Problem List Decreased strength;Decreased range of motion;Decreased activity tolerance;Decreased balance;Decreased knowledge of use of DME;Decreased safety awareness;Decreased cognition;Decreased mobility       PT Treatment Interventions DME instruction;Gait training;Functional mobility training;Therapeutic activities;Balance training;Therapeutic exercise;Patient/family education;Cognitive remediation    PT Goals (Current goals can be found in the Care Plan section)  Acute Rehab PT Goals Patient Stated Goal: get stronger PT Goal Formulation: With patient Time For Goal Achievement: 02/17/24 Potential to Achieve Goals: Good    Frequency Min 2X/week     Co-evaluation               AM-PAC PT 6 Clicks Mobility  Outcome Measure Help needed turning from your back to your side while in a flat bed without using bedrails?: A Little Help needed moving from lying on your back to sitting on the side of a flat bed without using bedrails?: A Little Help needed moving to and from a bed to a chair (including a wheelchair)?: A Little Help needed standing up from a chair using your arms (e.g., wheelchair or bedside chair)?: A Little Help needed to walk in hospital room?: A Little Help needed climbing 3-5 steps with a railing? : A Lot 6 Click Score: 17    End of Session Equipment Utilized During Treatment: Gait belt Activity Tolerance: Patient tolerated treatment  well Patient left: with bed alarm set;with call bell/phone within reach   PT Visit Diagnosis: Unsteadiness on feet (R26.81);Muscle weakness (generalized) (M62.81);Difficulty in walking, not elsewhere classified (R26.2);History of falling (Z91.81)    Time: 8374-8355 PT Time Calculation (min) (ACUTE ONLY): 19 min   Charges:   PT Evaluation $PT Eval Low Complexity: 1 Low PT Treatments $Gait Training: 8-22 mins PT General Charges $$ ACUTE PT VISIT: 1 Visit         Carmin JONELLE Deed, DPT 02/04/2024, 5:19 PM

## 2024-02-05 DIAGNOSIS — R55 Syncope and collapse: Secondary | ICD-10-CM | POA: Diagnosis not present

## 2024-02-05 LAB — GLUCOSE, CAPILLARY
Glucose-Capillary: 74 mg/dL (ref 70–99)
Glucose-Capillary: 160 mg/dL — ABNORMAL HIGH (ref 70–99)
Glucose-Capillary: 90 mg/dL (ref 70–99)
Glucose-Capillary: 162 mg/dL — ABNORMAL HIGH (ref 70–99)
Glucose-Capillary: 176 mg/dL — ABNORMAL HIGH (ref 70–99)

## 2024-02-05 MED ORDER — SODIUM CHLORIDE 0.9 % IV SOLN: INTRAVENOUS | Status: DC

## 2024-02-05 NOTE — TOC Initial Note (Signed)
 Transition of Care [facility]) - Initial/Assessment Note    Patient Details  Name: Haley Mooney MRN: [id number] Date of Birth: 1940-04-08  Transition of [facility]) CM/SW Contact:    Alvaro Louder, LCSW Phone Number: 02/05/2024, 3:59 PM  Clinical Narrative:      Per chart review patient is from home. PCP is Amy Weil. Patient selected HH company [facility] at DC.   TOC to follow for discharge                 Patient Goals and CMS Choice            Expected Discharge Plan and Services                                              Prior Living Arrangements/Services                       Activities of Daily Living   ADL Screening (condition at time of admission) Independently performs ADLs?: No Does the patient have a NEW difficulty with bathing/dressing/toileting/self-feeding that is expected to last >3 days?: No Does the patient have a NEW difficulty with getting in/out of bed, walking, or climbing stairs that is expected to last >3 days?: No Does the patient have a NEW difficulty with communication that is expected to last >3 days?: No Is the patient deaf or have difficulty hearing?: No Does the patient have difficulty seeing, even when wearing glasses/contacts?: No Does the patient have difficulty concentrating, remembering, or making decisions?: Yes  Permission Sought/Granted                  Emotional Assessment              Admission diagnosis:  Hypokalemia [E87.6] Abnormal EKG [R94.31] Generalized weakness [R53.1] Near syncope [R55] Patient Active Problem List   Diagnosis Date Noted   Near syncope 02/04/2024   DM (diabetes mellitus) (HCC) 02/04/2024   AKI (acute kidney injury) 12/30/2023   Essential hypertension 12/30/2023   Hyperlipidemia 12/30/2023   GERD (gastroesophageal reflux disease) 12/30/2023   GAD (generalized anxiety disorder) 12/30/2023   Bipolar disease, chronic (HCC) 12/30/2023   CVA (cerebral vascular accident)  (HCC) 08/21/2017   PCP:  Agustina Greig NOVAK, MD Pharmacy:   [facility] [id number] [place] - [address] [place] [address] Phone: [phone] Fax: [phone]     Social Drivers of Health (SDOH) Social History: SDOH Screenings   Food Insecurity: Patient Unable To Answer (02/04/2024)  Housing: Low Risk (02/04/2024)  Transportation Needs: Patient Unable To Answer (02/04/2024)  Utilities: Patient Unable To Answer (02/04/2024)  Financial Resource Strain: Low Risk (02/25/2023)   Received from [facility]  Physical Activity: Inactive (05/27/2023)   Received from [facility]  Social Connections: Unknown (02/04/2024)  Stress: No Stress Concern Present (05/27/2023)   Received from [name]  Tobacco Use: High Risk (02/04/2024)  Health Literacy: Medium Risk (02/25/2023)   Received from [facility]   SDOH Interventions:     Readmission Risk Interventions     No data to display

## 2024-02-05 NOTE — Progress Notes (Signed)
 " Progress Note   Patient: Haley Mooney FMW:[id number] DOB: 05-05-1940 DOA: 02/04/2024     0 DOS: the patient was seen and examined on 02/05/2024   Brief hospital course: From HPI Haley Mooney is a pleasant 84 y.o. female with medical history significant for stroke, DM, HTN, HLD, bipolar disorder, anxiety, mild dementia who was brought in for a fall at the [facility] living facility.  Patient stated that she was feeling weak on both legs, very tired and muscles giveaway.  She did not loss consciousness but she did hit her head.  She denies any headache, neck pain or external injuries.  She denies any fever, chills, nausea, vomiting, chest pain, cough, shortness of breath, palpitations, dysuria, hematuria.   ED Course: Upon arrival to the ED, patient is found to have reassuring workup except severe hypokalemia with potassium at 2.7, mild dehydration with creatinine of 1.05 and BUN 24, EKG with sinus sinus rhythm, CT head and CT cervical spine were unremarkable for fractures or stroke.  Urine analysis was positive for urinary tract infection.  Hospitalist service was consulted for evaluation for admission for a fall, UTI, muscle weakness and severe hypokalemia.      Assessment and Plan:   Fall at facility facility/presyncope, generalized weakness - Patient will be placed in observation in telemetry Appears slightly clinically dehydrated - PT/OT - Treatment of underlying cause. Continue IV fluid    Severe hypokalemia-improved - Will replace potassium and check magnesium level. Continue to monitor closely   Acute metabolic encephalopathy likely secondary to UTI Continue ceftriaxone  Follow-up on urine culture    HTN/HLD/history of stroke - Continue Plavix , metoprolol  and atorvastatin  - Continue monitor blood pressure    Dementia/anxiety/depression - Resume her home medication venlafaxine , trazodone , quetiapine  - Supportive care    Type 2 diabetes Continue insulin  therapy - Continue sugar  monitoring   DVT prophylaxis: Lovenox  Code Status: DNR/DNI(Do NOT Intubate) Consults called: None  Subjective:  Seen and examined at bedside this morning Denies nausea vomiting abdominal pain chest pain cough Patient refusing telemetry Patient was found to be confused and somewhat agitated today She remains unsafe for discharge and will continue to monitor at least overnight  Physical Exam:  Constitutional: Alert, awake, calm, comfortable HEENT: Neck supple Respiratory: Clear to auscultation B/L, no wheezing, no rales.  Cardiovascular: Regular rate and rhythm Abdomen: Soft, no tenderness, Bowel sounds positive.  Musculoskeletal: no clubbing / cyanosis. Good ROM, no contractures. Normal muscle tone.  Skin: no rashes, lesions, ulcers. Neurologic: CN 2-12 grossly intact. Sensation intact, No focal deficit identified Psychiatric: Alert but with intermittent agitation removing head telemetry    Vitals:   02/04/24 2025 02/05/24 0523 02/05/24 0747 02/05/24 1533  BP: (!) 140/77 113/63 121/72 112/61  Pulse: 87 83 79 73  Resp: 18 16 16 17   Temp: 97.8 F (36.6 C) 97.6 F (36.4 C) 98.2 F (36.8 C) 98.3 F (36.8 C)  TempSrc:    Oral  SpO2: 100% 99% 100% 100%  Weight: 76.6 kg     Height: 5' 6 (1.676 m)       Data Reviewed:    Latest Ref Rng & Units 02/04/2024    8:11 AM 02/04/2024    1:36 AM 01/03/2024    4:56 AM  CBC  WBC 4.0 - 10.5 K/uL 10.0  8.8  9.2   Hemoglobin 12.0 - 15.0 g/dL 89.8  9.7  89.6   Hematocrit 36.0 - 46.0 % 29.9  28.5  30.2   Platelets 150 -  400 K/uL 167  163  148        Latest Ref Rng & Units 02/04/2024    3:16 PM 02/04/2024    8:11 AM 02/04/2024    1:36 AM  BMP  Glucose 70 - 99 mg/dL 882   895   BUN 8 - 23 mg/dL 18   24   Creatinine 9.55 - 1.00 mg/dL 9.05  9.01  8.94   Sodium 135 - 145 mmol/L 139   137   Potassium 3.5 - 5.1 mmol/L 3.5   2.7   Chloride 98 - 111 mmol/L 104   98   CO2 22 - 32 mmol/L 24   26   Calcium  8.9 - 10.3 mg/dL 8.6   8.9        Author: Drue ONEIDA Potter, MD 02/05/2024 6:52 PM  For on call review [email or web address].  "

## 2024-02-05 NOTE — Care Management Obs Status (Signed)
 MEDICARE OBSERVATION STATUS NOTIFICATION   Patient Details  Name: Haley Mooney MRN: [id number] Date of Birth: August 13, 1940   Medicare Observation Status Notification Given:  Yes    Cambren Helm 02/05/2024, 11:58 AM

## 2024-02-05 NOTE — Plan of Care (Signed)

## 2024-02-05 NOTE — Progress Notes (Signed)
 Occupational Therapy Treatment Patient Details Name: Haley Mooney MRN: [id number] DOB: May 29, 1940 Today's Date: 02/05/2024   History of present illness Haley Mooney is a pleasant 84 y.o. female with medical history significant for stroke, DM, HTN, HLD, bipolar disorder, anxiety, mild dementia who was brought in after a fall at [facility] living facility.  Found to have UTI.   OT comments  Patient seen for OT treatment on this date. Upon arrival to room patient resting in bed, agreeable to treatment. Transitioned to EOB with min A, sit<>stand with CGA/min A, ambulated to sink to perform grooming tasks with CGA/SBA. Patient ambulated 100 feet with r/w, cues to maintain attention to task.  Patient ended treatment in recliner with bed/chair alarm on and all needs within reach. Patient making good progress toward goals, will continue to follow POC. Discharge recommendation remains appropriate.        If plan is discharge home, recommend the following:  A little help with walking and/or transfers;A little help with bathing/dressing/bathroom;Assistance with cooking/housework;Help with stairs or ramp for entrance;Assist for transportation   Equipment Recommendations  BSC/3in1    Recommendations for Other Services      Precautions / Restrictions Precautions Precautions: Fall Recall of Precautions/Restrictions: Impaired Restrictions Weight Bearing Restrictions Per Provider Order: No       Mobility Bed Mobility Overal bed mobility: Needs Assistance Bed Mobility: Sit to Supine     Supine to sit: Contact guard          Transfers Overall transfer level: Needs assistance Equipment used: Rolling walker (2 wheels) Transfers: Sit to/from Stand Sit to Stand: Contact guard assist, Min assist           General transfer comment: patient able to perform sit<>stand from EOB with CGA     Balance Overall balance assessment: Needs assistance, History of Falls Sitting-balance support:  Feet supported Sitting balance-Leahy Scale: Good     Standing balance support: Bilateral upper extremity supported, During functional activity, Reliant on assistive device for balance Standing balance-Leahy Scale: Fair                             ADL either performed or assessed with clinical judgement   ADL Overall ADL's : Needs assistance/impaired     Grooming: Wash/dry hands;Wash/dry face;Oral care;Supervision/safety;Standing;Contact guard assist Grooming Details (indicate cue type and reason): able to complete grooming tasks while standing at sink, cues for walker management                                    Extremity/Trunk Assessment Upper Extremity Assessment Upper Extremity Assessment: Generalized weakness            Vision       Perception     Praxis     Communication Communication Communication: Impaired Factors Affecting Communication: Reduced clarity of speech   Cognition Arousal: Alert Behavior During Therapy: WFL for tasks assessed/performed Cognition: Cognition impaired   Orientation impairments: Situation Awareness: Online awareness impaired Memory impairment (select all impairments): Short-term memory                       Following commands: Impaired Following commands impaired: Follows multi-step commands inconsistently      Cueing   Cueing Techniques: Verbal cues, Tactile cues  Exercises      Shoulder Instructions  General Comments      Pertinent Vitals/ Pain       Pain Assessment Pain Assessment: No/denies pain Pain Intervention(s): Monitored during session  Home Living                                          Prior Functioning/Environment              Frequency  Min 2X/week        Progress Toward Goals  OT Goals(current goals can now be found in the care plan section)  Progress towards OT goals: Progressing toward goals  Acute Rehab OT Goals Patient  Stated Goal: to get stronger OT Goal Formulation: With patient Time For Goal Achievement: 02/18/24 Potential to Achieve Goals: Good ADL Goals Pt Will Perform Upper Body Bathing: standing;with modified independence Pt Will Perform Lower Body Dressing: with modified independence;sit to/from stand Pt Will Transfer to Toilet: with modified independence;ambulating;regular height toilet  Plan      Co-evaluation                 AM-PAC OT 6 Clicks Daily Activity     Outcome Measure   Help from another person eating meals?: None Help from another person taking care of personal grooming?: A Little Help from another person toileting, which includes using toliet, bedpan, or urinal?: A Little Help from another person bathing (including washing, rinsing, drying)?: A Little Help from another person to put on and taking off regular upper body clothing?: A Little Help from another person to put on and taking off regular lower body clothing?: A Little 6 Click Score: 19    End of Session Equipment Utilized During Treatment: Rolling walker (2 wheels)  OT Visit Diagnosis: Unsteadiness on feet (R26.81);Other abnormalities of gait and mobility (R26.89);Repeated falls (R29.6);History of falling (Z91.81);Muscle weakness (generalized) (M62.81)   Activity Tolerance Patient tolerated treatment well   Patient Left in chair;with call bell/phone within reach;with chair alarm set   Nurse Communication Mobility status        Time: 9049-8982 OT Time Calculation (min): 27 min  Charges: OT General Charges $OT Visit: 1 Visit OT Treatments $Self Care/Home Management : 23-37 mins  Rogers Clause, OT/L MSOT, 02/05/2024

## 2024-02-05 NOTE — Progress Notes (Addendum)
 Physical Therapy Treatment Patient Details Name: Haley Mooney MRN: [id number] DOB: [birth year] Today's Date: 02/05/2024   History of Present Illness Haley Mooney is a pleasant 84 y.o. female with medical history significant for stroke, DM, HTN, HLD, bipolar disorder, anxiety, mild dementia who was brought in after a fall at [facility] living facility.  Found to have UTI.    PT Comments  Pt found walking in room with RW to bathroom  she had climbed over recliner leg rest and obtained RW and was at end of bed upon walking past room.  She stated she needed to use bathroom and was assisted.  Medium loose BM. She declined further activity at this time due to fatigue.  Recently worked with O and stated she has had enough activity for now..   Returned to room with safety on and reviewed need to use callbell for assist.  Techs aware.   If plan is discharge home, recommend the following: A little help with walking and/or transfers;A little help with bathing/dressing/bathroom;Assistance with cooking/housework;Assist for transportation;Supervision due to cognitive status   Can travel by private vehicle        Equipment Recommendations  None recommended by PT (has walker)    Recommendations for Other Services       Precautions / Restrictions Precautions Precautions: Fall Recall of Precautions/Restrictions: Intact Restrictions Weight Bearing Restrictions Per Provider Order: No     Mobility  Bed Mobility                 Patient Response: Cooperative  Transfers Overall transfer level: Needs assistance Equipment used: Rolling walker (2 wheels) Transfers: Sit to/from Stand Sit to Stand: Supervision                Ambulation/Gait Ambulation/Gait assistance: Contact guard assist, Supervision Gait Distance (Feet): 15 Feet Assistive device: Rolling walker (2 wheels) Gait Pattern/deviations: Step-through pattern Gait velocity: dec     General Gait Details: 15' x  2   Stairs             Wheelchair Mobility     Tilt Bed Tilt Bed Patient Response: Cooperative  Modified Rankin (Stroke Patients Only)       Balance Overall balance assessment: Needs assistance, History of Falls Sitting-balance support: Feet supported Sitting balance-Leahy Scale: Good     Standing balance support: Bilateral upper extremity supported, During functional activity, Reliant on assistive device for balance Standing balance-Leahy Scale: Fair                              Hotel Manager: No apparent difficulties Factors Affecting Communication: Reduced clarity of speech  Cognition Arousal: Alert Behavior During Therapy: WFL for tasks assessed/performed   PT - Cognitive impairments: History of cognitive impairments                       PT - Cognition Comments: climbed over recliner leg rest and was able to get to RW - found walking to bathroom with chair alarm on Following commands: Intact      Cueing Cueing Techniques: Verbal cues, Tactile cues  Exercises Other Exercises Other Exercises: to bathroom for med loose BM    General Comments        Pertinent Vitals/Pain Pain Assessment Pain Assessment: No/denies pain Pain Intervention(s): Monitored during session    Home Living  Prior Function            PT Goals (current goals can now be found in the care plan section) Progress towards PT goals: Progressing toward goals    Frequency    Min 2X/week      PT Plan      Co-evaluation              AM-PAC PT 6 Clicks Mobility   Outcome Measure  Help needed turning from your back to your side while in a flat bed without using bedrails?: A Little Help needed moving from lying on your back to sitting on the side of a flat bed without using bedrails?: A Little Help needed moving to and from a bed to a chair (including a wheelchair)?: A Little Help  needed standing up from a chair using your arms (e.g., wheelchair or bedside chair)?: A Little Help needed to walk in hospital room?: A Little Help needed climbing 3-5 steps with a railing? : A Lot 6 Click Score: 17    End of Session Equipment Utilized During Treatment: Gait belt Activity Tolerance: Patient tolerated treatment well Patient left: with bed alarm set;with call bell/phone within reach   PT Visit Diagnosis: Unsteadiness on feet (R26.81);Muscle weakness (generalized) (M62.81);Difficulty in walking, not elsewhere classified (R26.2);History of falling (Z91.81)     Time: 8964-8956 PT Time Calculation (min) (ACUTE ONLY): 8 min  Charges:    $Gait Training: 8-22 mins PT General Charges $$ ACUTE PT VISIT: 1 Visit                   Lauraine Gills, PTA 02/05/24, 10:49 AM

## 2024-02-06 ENCOUNTER — Other Ambulatory Visit: Payer: Self-pay

## 2024-02-06 DIAGNOSIS — R55 Syncope and collapse: Secondary | ICD-10-CM | POA: Diagnosis not present

## 2024-02-06 LAB — BASIC METABOLIC PANEL WITH GFR
Anion gap: 9 (ref 5–15)
BUN: 15 mg/dL (ref 8–23)
CO2: 24 mmol/L (ref 22–32)
Calcium: 8.9 mg/dL (ref 8.9–10.3)
Chloride: 107 mmol/L (ref 98–111)
Creatinine, Ser: 0.73 mg/dL (ref 0.44–1.00)
GFR, Estimated: >60 mL/min
Glucose, Bld: 86 mg/dL (ref 70–99)
Potassium: 3.4 mmol/L — ABNORMAL LOW (ref 3.5–5.1)
Sodium: 140 mmol/L (ref 135–145)

## 2024-02-06 LAB — URINE CULTURE: Culture: >=100000 — AB

## 2024-02-06 LAB — CBC WITH DIFFERENTIAL/PLATELET
Abs Immature Granulocytes: 0.03 10*3/uL (ref 0.00–0.07)
Basophils Absolute: 0 10*3/uL (ref 0.0–0.1)
Basophils Relative: 1 %
Eosinophils Absolute: 0.2 10*3/uL (ref 0.0–0.5)
Eosinophils Relative: 2 %
HCT: 31.2 % — ABNORMAL LOW (ref 36.0–46.0)
Hemoglobin: 10.2 g/dL — ABNORMAL LOW (ref 12.0–15.0)
Immature Granulocytes: 1 %
Lymphocytes Relative: 41 %
Lymphs Abs: 2.7 10*3/uL (ref 0.7–4.0)
MCH: 27.3 pg (ref 26.0–34.0)
MCHC: 32.7 g/dL (ref 30.0–36.0)
MCV: 83.6 fL (ref 80.0–100.0)
Monocytes Absolute: 0.3 10*3/uL (ref 0.1–1.0)
Monocytes Relative: 5 %
Neutro Abs: 3.4 10*3/uL (ref 1.7–7.7)
Neutrophils Relative %: 50 %
Platelets: 163 10*3/uL (ref 150–400)
RBC: 3.73 MIL/uL — ABNORMAL LOW (ref 3.87–5.11)
RDW: 13.6 % (ref 11.5–15.5)
WBC: 6.7 10*3/uL (ref 4.0–10.5)
nRBC: 0 % (ref 0.0–0.2)

## 2024-02-06 LAB — GLUCOSE, CAPILLARY
Glucose-Capillary: 83 mg/dL (ref 70–99)
Glucose-Capillary: 130 mg/dL — ABNORMAL HIGH (ref 70–99)

## 2024-02-06 MED ORDER — CEPHALEXIN 500 MG PO CAPS: 500.0000 mg | ORAL_CAPSULE | Freq: Two times a day (BID) | ORAL | 0 refills | Status: AC

## 2024-02-06 MED ORDER — CEPHALEXIN 500 MG PO CAPS
500.0000 mg | ORAL_CAPSULE | Freq: Two times a day (BID) | ORAL | 0 refills | Status: DC
  Filled 2024-02-06: qty 6, 3d supply, fill #0

## 2024-02-06 MED ORDER — POTASSIUM CHLORIDE 20 MEQ PO PACK
40.0000 meq | PACK | Freq: Once | ORAL | Status: AC
  Administered 2024-02-06: 40 meq via ORAL
  Filled 2024-02-06: qty 2

## 2024-02-06 NOTE — Care Plan (Signed)
 PT was changed into her clothes to be transporter to facility. Daughter updated IV removed Phone and charger packed and given to patient  MD to send RX to facility.

## 2024-02-06 NOTE — TOC Transition Note (Signed)
 Transition of Care [facility]) - Discharge Note   Patient Details  Name: Haley Mooney MRN: [id number] Date of Birth: [birth year]  Transition of Care [facility]) CM/SW Contact:  Alvaro Louder, LCSW Phone Number: 02/06/2024, 12:35 PM   Clinical Narrative:   ISRAEL confirmed with MD that patient is stable for discharge. LCSWA notified the patient and they are in agreement with discharge. LCSWA discussed PT recommendation of HH Patient was agreeable. LCSWA reached out to [facility] admissions coordinator and started service for patient. [facility] to come pick the patient. Daughter Dorothyann Aware.   TOC signing off  Final next level of care: Assisted Living Barriers to Discharge: No Barriers Identified   Patient Goals and CMS Choice            Discharge Placement              Patient chooses bed at:  [facility]) Patient to be transferred to facility by: [facility] Name of family member notified: Dorothyann Patient and family notified of of transfer: 02/06/24  Discharge Plan and Services Additional resources added to the After Visit Summary for                            [facility] Arranged: PT, OT [facility] Agency: [facility] Date [facility] Contacted: 02/06/24   Representative spoke with at [facility] Agency: [place]  Social Drivers of Health (SDOH) Interventions SDOH Screenings   Food Insecurity: Patient Unable To Answer (02/04/2024)  Housing: Low Risk (02/04/2024)  Transportation Needs: Patient Unable To Answer (02/04/2024)  Utilities: Patient Unable To Answer (02/04/2024)  Financial Resource Strain: Low Risk (02/25/2023)   Received from [facility]  Physical Activity: Inactive (05/27/2023)   Received from [facility]  Social Connections: Unknown (02/04/2024)  Stress: No Stress Concern Present (05/27/2023)   Received from [facility]  Tobacco Use: High Risk (02/04/2024)  Health Literacy: Medium Risk (02/25/2023)   [facility] from [facility]      Readmission Risk Interventions     No data to display

## 2024-02-06 NOTE — Plan of Care (Signed)

## 2024-02-09 ENCOUNTER — Other Ambulatory Visit: Payer: Self-pay
